# Patient Record
Sex: Female | Born: 2000 | Marital: Single | Smoking: Never smoker
Health system: Southern US, Community
[De-identification: ages and names within clinical notes are randomized; demographics above are authoritative.]

## PROBLEM LIST (undated history)

## (undated) DIAGNOSIS — F429 Obsessive-compulsive disorder, unspecified: Secondary | ICD-10-CM

## (undated) DIAGNOSIS — F329 Major depressive disorder, single episode, unspecified: Secondary | ICD-10-CM

## (undated) DIAGNOSIS — F32A Depression, unspecified: Secondary | ICD-10-CM

## (undated) DIAGNOSIS — F419 Anxiety disorder, unspecified: Secondary | ICD-10-CM

## (undated) HISTORY — DX: Obsessive-compulsive disorder, unspecified: F42.9

## (undated) HISTORY — DX: Depression, unspecified: F32.A

## (undated) HISTORY — DX: Anxiety disorder, unspecified: F41.9

## (undated) HISTORY — PX: WISDOM TOOTH EXTRACTION: SHX21

## (undated) HISTORY — DX: Major depressive disorder, single episode, unspecified: F32.9

## (undated) HISTORY — PX: ADENOIDECTOMY AND MYRINGOTOMY WITH TUBE PLACEMENT: SHX5714

---
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
## 2019-11-17 ENCOUNTER — Other Ambulatory Visit: Payer: Self-pay

## 2019-11-17 ENCOUNTER — Ambulatory Visit: Payer: No Typology Code available for payment source | Admitting: Nurse Practitioner

## 2019-11-17 VITALS — BP 110/70 | HR 89 | Temp 98.0°F | Ht 63.5 in | Wt 157.4 lb

## 2019-11-17 DIAGNOSIS — F330 Major depressive disorder, recurrent, mild: Secondary | ICD-10-CM

## 2019-11-17 DIAGNOSIS — L709 Acne, unspecified: Secondary | ICD-10-CM

## 2019-11-17 DIAGNOSIS — F419 Anxiety disorder, unspecified: Secondary | ICD-10-CM | POA: Diagnosis not present

## 2019-11-17 DIAGNOSIS — R7989 Other specified abnormal findings of blood chemistry: Secondary | ICD-10-CM

## 2019-11-17 DIAGNOSIS — Z0001 Encounter for general adult medical examination with abnormal findings: Secondary | ICD-10-CM

## 2019-11-17 DIAGNOSIS — R251 Tremor, unspecified: Secondary | ICD-10-CM

## 2019-11-17 DIAGNOSIS — Z0000 Encounter for general adult medical examination without abnormal findings: Secondary | ICD-10-CM

## 2019-11-17 LAB — COMPREHENSIVE METABOLIC PANEL
ALT: 11 U/L (ref 0–35)
AST: 14 U/L (ref 0–37)
Albumin: 4.5 g/dL (ref 3.5–5.2)
Alkaline Phosphatase: 52 U/L (ref 47–119)
BUN: 15 mg/dL (ref 6–23)
CO2: 26 mEq/L (ref 19–32)
Calcium: 9.1 mg/dL (ref 8.4–10.5)
Chloride: 106 mEq/L (ref 96–112)
Creatinine, Ser: 0.7 mg/dL (ref 0.40–1.20)
GFR: 107.4 mL/min (ref >60.00)
Glucose, Bld: 87 mg/dL (ref 70–99)
Potassium: 4 mEq/L (ref 3.5–5.1)
Sodium: 139 mEq/L (ref 135–145)
Total Bilirubin: 0.5 mg/dL (ref 0.2–1.2)
Total Protein: 6.7 g/dL (ref 6.0–8.3)

## 2019-11-17 LAB — CBC
HCT: 38.5 % (ref 36.0–49.0)
Hemoglobin: 12.8 g/dL (ref 12.0–16.0)
MCHC: 33.3 g/dL (ref 31.0–37.0)
MCV: 90.6 fl (ref 78.0–98.0)
Platelets: 324 10*3/uL (ref 150.0–575.0)
RBC: 4.25 Mil/uL (ref 3.80–5.70)
RDW: 12.4 % (ref 11.4–15.5)
WBC: 6.4 10*3/uL (ref 4.5–13.5)

## 2019-11-17 LAB — LIPID PANEL
Cholesterol: 192 mg/dL (ref 0–200)
HDL: 45.9 mg/dL (ref >39.00)
LDL Cholesterol: 129 mg/dL — ABNORMAL HIGH (ref 0–99)
NonHDL: 146.33
Total CHOL/HDL Ratio: 4
Triglycerides: 89 mg/dL (ref 0.0–149.0)
VLDL: 17.8 mg/dL (ref 0.0–40.0)

## 2019-11-17 LAB — TSH: TSH: 0.62 u[IU]/mL (ref 0.40–5.00)

## 2019-11-17 LAB — VITAMIN D 25 HYDROXY (VIT D DEFICIENCY, FRACTURES): VITD: 9.57 ng/mL — ABNORMAL LOW (ref 30.00–100.00)

## 2019-11-17 LAB — HEMOGLOBIN A1C: Hgb A1c MFr Bld: 4.9 % (ref 4.6–6.5)

## 2019-11-17 MED ORDER — CHOLECALCIFEROL 1.25 MG (50000 UT) PO TABS: ORAL_TABLET | ORAL | 0 refills | Status: DC

## 2019-11-17 NOTE — Patient Instructions (Addendum)
It was nice to meet you today.  Request for follow-up to dermatology for your acne as you requested.  Please go to the lab today for routine preventative healthcare labs.  Follow-up for routine eye exam annually, dental exam annually, and continue with the behavioral medicine as you are doing.  Reviewed healthy lifestyle with healthy diet, regular exercise.  Please contact us for any questions or concerns and otherwise we can see you back in 1 year.  Preventive Care 15-64 Years Old, Female Preventive care refers to lifestyle choices and visits with your health care provider that can promote health and wellness. At this stage in your life, you may start seeing a primary care physician instead of a pediatrician. Your health care is now your responsibility. Preventive care for young adults includes:  A yearly physical exam. This is also called an annual wellness visit.  Regular dental and eye exams.  Immunizations.  Screening for certain conditions.  Healthy lifestyle choices, such as diet and exercise. What can I expect for my preventive care visit? Physical exam Your health care provider may check:  Height and weight. These may be used to calculate body mass index (BMI), which is a measurement that tells if you are at a healthy weight.  Heart rate and blood pressure.  Body temperature. Counseling Your health care provider may ask you questions about:  Past medical problems and family medical history.  Alcohol, tobacco, and drug use.  Home and relationship well-being.  Access to firearms.  Emotional well-being.  Diet, exercise, and sleep habits.  Sexual activity and sexual health.  Method of birth control.  Menstrual cycle.  Pregnancy history. What immunizations do I need?  Influenza (flu) vaccine  This is recommended every year. Tetanus, diphtheria, and pertussis (Tdap) vaccine  You may need a Td booster every 10 years. Varicella (chickenpox) vaccine  You  may need this vaccine if you have not already been vaccinated. Human papillomavirus (HPV) vaccine  If recommended by your health care provider, you may need three doses over 6 months. Measles, mumps, and rubella (MMR) vaccine  You may need at least one dose of MMR. You may also need a second dose. Meningococcal conjugate (MenACWY) vaccine  One dose is recommended if you are 6-54 years old and a Market researcher living in a residence hall, or if you have one of several medical conditions. You may also need additional booster doses. Pneumococcal conjugate (PCV13) vaccine  You may need this if you have certain conditions and were not previously vaccinated. Pneumococcal polysaccharide (PPSV23) vaccine  You may need one or two doses if you smoke cigarettes or if you have certain conditions. Hepatitis A vaccine  You may need this if you have certain conditions or if you travel or work in places where you may be exposed to hepatitis A. Hepatitis B vaccine  You may need this if you have certain conditions or if you travel or work in places where you may be exposed to hepatitis B. Haemophilus influenzae type b (Hib) vaccine  You may need this if you have certain risk factors. You may receive vaccines as individual doses or as more than one vaccine together in one shot (combination vaccines). Talk with your health care provider about the risks and benefits of combination vaccines. What tests do I need? Blood tests  Lipid and cholesterol levels. These may be checked every 5 years starting at age 34.  Hepatitis C test.  Hepatitis B test. Screening  Pelvic exam and  Pap test. This may be done every 3 years starting at age 45.  Sexually transmitted disease (STD) testing, if you are at risk.  BRCA-related cancer screening. This may be done if you have a family history of breast, ovarian, tubal, or peritoneal cancers. Other tests  Tuberculosis skin test.  Vision and hearing  tests.  Skin exam.  Breast exam. Follow these instructions at home: Eating and drinking   Eat a diet that includes fresh fruits and vegetables, whole grains, lean protein, and low-fat dairy products.  Drink enough fluid to keep your urine pale yellow.  Do not drink alcohol if: ? Your health care provider tells you not to drink. ? You are pregnant, may be pregnant, or are planning to become pregnant. ? You are under the legal drinking age. In the [place] the legal drinking age is 67.  If you drink alcohol: ? Limit how much you have to 0-1 drink a day. ? Be aware of how much alcohol is in your drink. In the [place] one drink equals one 12 oz bottle of beer (355 mL), one 5 oz glass of wine (148 mL), or one 1 oz glass of hard liquor (44 mL). Lifestyle  Take daily care of your teeth and gums.  Stay active. Exercise at least 30 minutes 5 or more days of the week.  Do not use any products that contain nicotine or tobacco, such as cigarettes, e-cigarettes, and chewing tobacco. If you need help quitting, ask your health care provider.  Do not use drugs.  If you are sexually active, practice safe sex. Use a condom or other form of birth control (contraception) in order to prevent pregnancy and STIs (sexually transmitted infections). If you plan to become pregnant, see your health care provider for a pre-conception visit.  Find healthy ways to cope with stress, such as: ? Meditation, yoga, or listening to music. ? Journaling. ? Talking to a trusted person. ? Spending time with friends and family. Safety  Always wear your seat belt while driving or riding in a vehicle.  Do not drive if you have been drinking alcohol. Do not ride with someone who has been drinking.  Do not drive when you are tired or distracted. Do not text while driving.  Wear a helmet and other protective equipment during sports activities.  If you have firearms in your house, make sure you follow all gun safety  procedures.  Seek help if you have been bullied, physically abused, or sexually abused.  Use the Internet responsibly to avoid dangers such as online bullying and online sex predators. What's next?  Go to your health care provider once a year for a well check visit.  Ask your health care provider how often you should have your eyes and teeth checked.  Stay up to date on all vaccines. This information is not intended to replace advice given to you by your health care provider. Make sure you discuss any questions you have with your health care provider. Document Revised: 07/14/2018 Document Reviewed: 07/14/2018 Elsevier Patient Education  2020 [place].  Acne  Acne is a skin problem that causes pimples and other skin changes. The skin has many tiny openings called pores. Each pore contains an oil gland. Oil glands make an oily substance that is called sebum. Acne occurs when the pores in the skin get blocked. The pores may become infected with bacteria, or they may become red, sore, and swollen. Acne is a common skin problem, especially for teenagers.  It often occurs on the face, neck, chest, upper arms, and back. Acne usually goes away over time. What are the causes? Acne is caused when oil glands get blocked with sebum, dead skin cells, and dirt. The bacteria that are normally found in the oil glands then multiply and cause inflammation. Acne is commonly triggered by changes in your hormones. These hormonal changes can cause the oil glands to get bigger and to make more sebum. Factors that can make acne worse include:  Hormone changes during: ? Adolescence. ? Women's menstrual cycles. ? Pregnancy.  Oil-based cosmetics and hair products.  Stress.  Hormone problems that are caused by certain diseases.  Certain medicines.  Pressure from headbands, backpacks, or shoulder pads.  Exposure to certain oils and chemicals.  Eating a diet high in carbohydrates that quickly turn to  sugar. These include dairy products, desserts, and chocolates. What increases the risk? This condition is more likely to develop in:  Teenagers.  People who have a family history of acne. What are the signs or symptoms? Symptoms include:  Small, red bumps (pimples or papules).  Whiteheads.  Blackheads.  Small, pus-filled pimples (pustules).  Big, red pimples or pustules that feel tender. More severe acne can cause:  An abscess. This is an infected area that contains a collection of pus.  Cysts. These are hard, painful, fluid-filled sacs.  Scars. These can happen after large pimples heal. How is this diagnosed? This condition is diagnosed with a medical history and physical exam. Blood tests may also be done. How is this treated? Treatment for this condition can vary depending on the severity of your acne. Treatment may include:  Creams and lotions that prevent oil glands from clogging.  Creams and lotions that treat or prevent infections and inflammation.  Antibiotic medicines that are applied to the skin or taken as a pill.  Pills that decrease sebum production.  Birth control pills.  Light or laser treatments.  Injections of medicine into the affected areas.  Chemicals that cause peeling of the skin.  Surgery. Your health care provider will also recommend the best way to take care of your skin. Good skin care is the most important part of treatment. Follow these instructions at home: Skin care Take care of your skin as told by your health care provider. You may be told to do these things:  Wash your skin gently at least two times each day, as well as: ? After you exercise. ? Before you go to bed.  Use mild soap.  Apply a water-based skin moisturizer after you wash your skin.  Use a sunscreen or sunblock with SPF 30 or greater. This is especially important if you are using acne medicines.  Choose cosmetics that will not block your oil glands (are  noncomedogenic). Medicines  Take over-the-counter and prescription medicines only as told by your health care provider.  If you were prescribed an antibiotic medicine, apply it or take it as told by your health care provider. Do not stop using the antibiotic even if your condition improves. General instructions  Keep your hair clean and off your face. If you have oily hair, shampoo your hair regularly or daily.  Avoid wearing tight headbands or hats.  Avoid picking or squeezing your pimples. That can make your acne worse and cause scarring.  Shave gently and only when necessary.  Keep a food journal to figure out if any foods are linked to your acne. Avoid dairy products, desserts, and chocolates.  Take  steps to manage and reduce stress.  Keep all follow-up visits as told by your health care provider. This is important. Contact a health care provider if:  Your acne is not better after eight weeks.  Your acne gets worse.  You have a large area of skin that is red or tender.  You think that you are having side effects from any acne medicine. Summary  Acne is a skin problem that causes pimples and other skin changes. Acne is a common skin problem, especially for teenagers. Acne usually goes away over time.  Acne is commonly triggered by changes in your hormones. There are many other causes, such as stress, diet, and certain medicines.  Follow your health care provider's instructions for how to take care of your skin. Good skin care is the most important part of treatment.  Take over-the-counter and prescription medicines only as told by your health care provider.  Contact your health care provider if you think that you are having side effects from any acne medicine. This information is not intended to replace advice given to you by your health care provider. Make sure you discuss any questions you have with your health care provider. Document Revised: 11/30/2017 Document  Reviewed: 11/30/2017 Elsevier Patient Education  [place].

## 2019-11-17 NOTE — Progress Notes (Signed)
Established Patient Office Visit  Subjective:  Patient ID: Sylvia Meadows, female    DOB: 2000/08/29  Age: 19 y.o. MRN: [id number]  CC:  Chief Complaint  Patient presents with  . Annual Exam    HPI Sylvia Meadows is a 19 yo here  to establish care with new provider and is fasting  for her preventative care visit and annual labs.  She reports 3 concerns today.  1. Bilateral hands with fine tremors - for many years- and may be a little worse. She does not have problems with her thyroid, blood sugar, palpitations, dizziness lightheadedness or weakness.  She does not know exactly when her hands started to shake, but notes it is all of the time and not sure if it is related to chronic anxiety.  She does not take any prescription or over-the-counter medications.  Mylo  reports her mental health therapist requested that she get physical exam.   2. MDD/ Chronic anxiety/OCD/ADHD- she is having a flare of anxiety. Patient is reports she has been seeing a therapist since she was age 63 to 19 years old.  In addition, she  most recently saw Dr. Einar Grad  in psychiatry in 2018.   She used to take medication and most recently she came off her Zoloft last year.  She tried Celexa and Ritalin in 2018.  She says none of the medication helps her anxiety. She has seen 2  psychiatrists and does not want to see another one now.  She does not want to start on another medication at this time.  She reports she is safe at home. She denies any suicidal or homicidal thoughts.  Her PHQ-9 today was 17 and GAD-7 score of 20. She says her mental health is up and down and has been for years. She feels like she is doing better than in the past. She sees her therapist, Dr. Sondra Come once a week and she believes she is doing better despite the high screening scores today.   She is single and lives at home with her mother and sister.  She reports a good relationship with her mother.  She works in Psychologist, educational in Scientist, research (life sciences).  She currently does not have a boyfriend.   2. Acne on her face and shoulders and is requesting a dermatology referral.  She reports she has tried over-the-counter medication without improvement.  Health maintenance:  Immunizations:Covid 11/04/2019, remaining UTD- see list  Diet: regular Exercise: active - no formal  Pap Smear: to begin age 72 Menses: LMP end of last month, regular and having no problems. EYE: 2 mos ago routine Dentist: UTD  Past Medical History:  Diagnosis Date  . Anxiety   . Depression   . Obsessive-compulsive disorder     Past Surgical History:  Procedure Laterality Date  . ADENOIDECTOMY AND MYRINGOTOMY WITH TUBE PLACEMENT    . WISDOM TOOTH EXTRACTION      Family History  Problem Relation Age of Onset  . Hyperlipidemia Mother   . Diabetes Mother   . Mental illness Mother   . Depression Mother   . Alcohol abuse Father   . Asthma Sister   . Depression Maternal Aunt   . Anxiety disorder Maternal Aunt   . Anxiety disorder Maternal Grandfather   . Depression Maternal Grandfather   . Hyperlipidemia Maternal Grandfather   . Hypertension Maternal Grandfather   . Depression Paternal Grandmother   . Anxiety disorder Paternal Grandmother   . Arthritis Maternal Grandmother   .  Mental illness Maternal Grandmother     Social History   Socioeconomic History  . Marital status: Single    Spouse name: Not on file  . Number of children: Not on file  . Years of education: Not on file  . Highest education level: Not on file  Occupational History  . Occupation: Pensions consultant in [employer]  Tobacco Use  . Smoking status: Never Smoker  . Smokeless tobacco: Never Used  Substance and Sexual Activity  . Alcohol use: No  . Drug use: No  . Sexual activity: Never    Birth control/protection: None  Other Topics Concern  . Not on file  Social History Narrative    She is single and lives at home with her mother and sister.  She works in  Psychologist, educational in Oncologist.  She currently does not have a boyfriend.  She reports she is safe at home.   Social Determinants of Health   Financial Resource Strain:   . Difficulty of Paying Living Expenses:   Food Insecurity:   . Worried About Charity fundraiser in the Last Year:   . Arboriculturist in the Last Year:   Transportation Needs:   . Film/video editor (Medical):   Marland Kitchen Lack of Transportation (Non-Medical):   Physical Activity:   . Days of Exercise per Week:   . Minutes of Exercise per Session:   Stress:   . Feeling of Stress :   Social Connections:   . Frequency of Communication with Friends and Family:   . Frequency of Social Gatherings with Friends and Family:   . Attends Religious Services:   . Active Member of Clubs or Organizations:   . Attends Archivist Meetings:   Marland Kitchen Marital Status:   Intimate Partner Violence:   . Fear of Current or Ex-Partner:   . Emotionally Abused:   Marland Kitchen Physically Abused:   . Sexually Abused:     No Known Allergies   Review of Systems  Constitutional: Negative for activity change, chills, fever and unexpected weight change.  HENT: Negative for congestion and sore throat.   Eyes: Negative.   Respiratory: Negative for cough and shortness of breath.   Cardiovascular: Negative for chest pain and palpitations.  Gastrointestinal: Negative.   Endocrine: Negative for cold intolerance and heat intolerance.  Genitourinary: Negative.   Musculoskeletal: Negative.   Skin:       Acne on face and shoulders.   Neurological: Positive for tremors. Negative for dizziness and light-headedness.  Hematological: Negative for adenopathy. Does not bruise/bleed easily.  Psychiatric/Behavioral:       Per HPI      Objective:    Physical Exam  Constitutional: She is oriented to person, place, and time. She appears well-developed and well-nourished.  HENT:  Head: Normocephalic and atraumatic.  Eyes: Pupils are equal, round,  and reactive to light. Conjunctivae and EOM are normal.  Neck: No thyromegaly present.  Cardiovascular: Normal rate, regular rhythm and normal heart sounds.  Pulmonary/Chest: Effort normal and breath sounds normal.  Abdominal: Soft. There is no abdominal tenderness.  Genitourinary:    Genitourinary Comments: Declines breast exam today.   Musculoskeletal:        General: Normal range of motion.     Cervical back: Normal range of motion and neck supple.  Neurological: She is alert and oriented to person, place, and time. No cranial nerve deficit or sensory deficit.  Reflex Scores:      Patellar reflexes are  2+ on the right side and 2+ on the left side. Fine hand tremors bilat as seen when holding a piece of paper.   Skin: Skin is warm and dry.  Light acne forehead, nose Nose ring.    Psychiatric: Her speech is normal and behavior is normal. Judgment and thought content normal. Her mood appears anxious. Cognition and memory are normal.  Vitals reviewed.   BP 110/70   Pulse 89   Temp 98 F (36.7 C) (Skin)   Ht 5' 3.5" (1.613 m)   Wt 157 lb 6.4 oz (71.4 kg)   SpO2 97%   BMI 27.44 kg/m  Wt Readings from Last 3 Encounters:  11/17/19 157 lb 6.4 oz (71.4 kg) (86 %, Z= 1.10)*  09/22/18 149 lb 6.4 oz (67.8 kg) (83 %, Z= 0.97)*  08/25/18 144 lb 9.6 oz (65.6 kg) (80 %, Z= 0.83)*   * Growth percentiles are based on CDC (Girls, 2-20 Years) data.     Health Maintenance Due  Topic Date Due  . HIV Screening  Never done  . COVID-19 Vaccine (1) Never done    There are no preventive care reminders to display for this patient.  Lab Results  Component Value Date   TSH 0.62 11/17/2019   Lab Results  Component Value Date   WBC 6.4 11/17/2019   HGB 12.8 11/17/2019   HCT 38.5 11/17/2019   MCV 90.6 11/17/2019   PLT 324.0 11/17/2019   Lab Results  Component Value Date   NA 139 11/17/2019   K 4.0 11/17/2019   CO2 26 11/17/2019   GLUCOSE 87 11/17/2019   BUN 15 11/17/2019    CREATININE 0.70 11/17/2019   BILITOT 0.5 11/17/2019   ALKPHOS 52 11/17/2019   AST 14 11/17/2019   ALT 11 11/17/2019   PROT 6.7 11/17/2019   ALBUMIN 4.5 11/17/2019   CALCIUM 9.1 11/17/2019   GFR 107.40 11/17/2019   Lab Results  Component Value Date   CHOL 192 11/17/2019   Lab Results  Component Value Date   HDL 45.90 11/17/2019   Lab Results  Component Value Date   LDLCALC 129 (H) 11/17/2019   Lab Results  Component Value Date   TRIG 89.0 11/17/2019   Lab Results  Component Value Date   CHOLHDL 4 11/17/2019   Lab Results  Component Value Date   HGBA1C 4.9 11/17/2019      Assessment & Plan:   Problem List Items Addressed This Visit      Other   MDD (major depressive disorder), recurrent episode, mild ([place])    Other Visit Diagnoses    Encounter for preventative adult health care examination    -  Primary   Relevant Orders   Comp Met (CMET) (Completed)   TSH (Completed)   HgB A1c (Completed)   Vitamin D (25 hydroxy) (Completed)   Lipid Profile (Completed)   CBC (Completed)   Acne, unspecified acne type       Relevant Orders   Ambulatory referral to Dermatology   Anxiety         1. Request for follow-up to dermatology for your acne as you requested.  2. She declined a Psychiatric referral. She reports she is seeing Dr. Sondra Come and advised continue with her behavioral therapist who may also have a psychiatrist recommendation.   3. For hand fine tremors: Check TSH and treat anxiety. She declines any medication today.   4. Please go to the lab today for routine preventative healthcare labs- she  presented fasting.  Follow-up for routine eye exam annually, dental exam annually.Screening: ASCCP guidelines were reviewed.  Pap smears are recommended beginning at age 52. Reviewed healthy lifestyle with healthy diet, regular exercise, seatbelt use, avoid texting and driving, smoking, and underage alcohol use.    Please contact us for any questions or  concerns and otherwise we can see you back in 1 year.  Meds ordered this encounter  Medications  . Cholecalciferol 1.25 MG (50000 UT) TABS    Sig: 50,000 units PO qwk for 12 weeks.    Dispense:  12 tablet    Refill:  0    Order Specific Question:   Supervising Provider    Answer:   Einar Pheasant [[id number]]   This visit occurred during the SARS-CoV-2 public health emergency.  Safety protocols were in place, including screening questions prior to the visit, additional usage of staff PPE, and extensive cleaning of exam room while observing appropriate contact time as indicated for disinfecting solutions.     Follow-up: No follow-ups on file.    Denice Paradise, NP

## 2019-11-18 MED FILL — VIT D3-50 50,000 UNITS CAPS: 1.25 MG | 84 days supply | Qty: 12 | Fill #0

## 2019-11-19 ENCOUNTER — Telehealth: Payer: Self-pay | Admitting: Nurse Practitioner

## 2019-11-19 ENCOUNTER — Encounter: Payer: Self-pay | Admitting: Nurse Practitioner

## 2019-11-19 DIAGNOSIS — L709 Acne, unspecified: Secondary | ICD-10-CM

## 2019-11-19 DIAGNOSIS — F330 Major depressive disorder, recurrent, mild: Secondary | ICD-10-CM | POA: Insufficient documentation

## 2019-11-19 DIAGNOSIS — R251 Tremor, unspecified: Secondary | ICD-10-CM

## 2019-11-19 DIAGNOSIS — R7989 Other specified abnormal findings of blood chemistry: Secondary | ICD-10-CM

## 2019-11-19 HISTORY — DX: Tremor, unspecified: R25.1

## 2019-11-19 HISTORY — DX: Other specified abnormal findings of blood chemistry: R79.89

## 2019-11-19 HISTORY — DX: Acne, unspecified: L70.9

## 2019-11-19 NOTE — Assessment & Plan Note (Signed)
  Last vitamin D Lab Results  Component Value Date   VD25OH 9.57 (L) 11/17/2019  Labs returned with very low Vit D level <12 and according to. UTD at risk for developing osteomalacia.  We have already checked the routine labs and they are all normal : Ca, renal, electrolytes, liver including a normal ALKP, but  they recommend adding: phosphorus, PTH, and TTG antibodies to assess for celiac disease. Labs ordered for 11/24/19. Will begin Vit D supplement.

## 2019-11-19 NOTE — Assessment & Plan Note (Signed)
4. She wants a different DERM referral- [facility] and her mother wants her to go to [facility]- no specified provider.

## 2019-11-19 NOTE — Assessment & Plan Note (Addendum)
1. Labs return with TSH low normal 0.62 Will check thyroid panel 11/24/19.   2. Because of fine motor tremors in her hands- for years  plus depression,  I would add beginning work up for [employer] with serum ceruloplasmin, a good slit lamp evaluation by her established optometrist, Dr. Clydene Pugh. And I will consider need for a 24 hour urine  for copper based on the above results.   3. Consider neurology consult if all negative.

## 2019-11-19 NOTE — Telephone Encounter (Signed)
I called her today with results of her labs vit D 9.7, TSH low normal 0.62  If very low Vit D level <12 and according to. UTD at risk for developing osteomalacia.  We have already checked the routine labs and they are all normal : Ca, renal, electrolytes, liver including a normal ALKP, but  they recommend adding: phosphorus, PTH, and TTG antibodies to assess for celiac disease.   PLAN: 1. Please call her [phone] with a lab appt before 1 pm next [facility] 11/24/19.   2. Because of fine motor tremors plus depression,  I would add beginning work up for [employer] with serum ceruloplasmin, a good slit lamp evaluation by her established optometrist, Dr. Clydene Pugh. And I will consider need for a 24 hour urine  for copper based on the above results.    Her liver panel is normal, but neurological Sx (fine tremors- I've had for a long time) plus  depression need Wilson's ruled out.   3. Consider neurology consult if all negative.   4. She wants a different DERM referral- [facility] and her mother wants her to go to [facility]- no specified provider.

## 2019-11-20 NOTE — Telephone Encounter (Signed)
Mychart has been sent to inform patient. 

## 2019-11-22 DIAGNOSIS — R251 Tremor, unspecified: Secondary | ICD-10-CM

## 2019-11-24 ENCOUNTER — Other Ambulatory Visit: Payer: Self-pay

## 2019-11-24 ENCOUNTER — Other Ambulatory Visit: Payer: No Typology Code available for payment source

## 2019-11-24 DIAGNOSIS — R251 Tremor, unspecified: Secondary | ICD-10-CM

## 2019-11-24 DIAGNOSIS — R7989 Other specified abnormal findings of blood chemistry: Secondary | ICD-10-CM

## 2019-11-24 DIAGNOSIS — F330 Major depressive disorder, recurrent, mild: Secondary | ICD-10-CM

## 2019-11-24 NOTE — Addendum Note (Signed)
Addended by: Warden Fillers on: 11/24/2019 10:39 AM   Modules accepted: Orders

## 2019-11-28 LAB — THYROID PANEL WITH TSH
Free Thyroxine Index: 2.2 (ref 1.4–3.8)
T3 Uptake: 31 % (ref 22–35)
T4, Total: 7 ug/dL (ref 5.3–11.7)
TSH: 0.72 mIU/L

## 2019-11-28 LAB — CERULOPLASMIN: Ceruloplasmin: 23 mg/dL (ref 18–53)

## 2019-11-28 LAB — CELIAC DISEASE PANEL
(tTG) Ab, IgA: 1 U/mL
(tTG) Ab, IgG: 2 U/mL
Gliadin IgA: 18 Units
Gliadin IgG: 3 Units
Immunoglobulin A: 134 mg/dL (ref 47–310)

## 2019-11-28 LAB — PHOSPHORUS: Phosphorus: 3.2 mg/dL (ref 2.5–4.5)

## 2019-11-28 LAB — PARATHYROID HORMONE, INTACT (NO CA): PTH: 33 pg/mL (ref 14–64)

## 2019-11-29 NOTE — Telephone Encounter (Signed)
LMTCB

## 2019-11-29 NOTE — Telephone Encounter (Signed)
Please call her: Special labs looking at reasons why your vitamin D is so low are all normal. I cannot explain your tremors. Would you like a referral to NEUROLOGY? Did you get a DERMATOLOGY appt?

## 2019-12-07 NOTE — Telephone Encounter (Signed)
I have placed a neurology referral for chronic and significant hand tremors. Normal thyroid panel. Positive anxiety.

## 2019-12-07 NOTE — Telephone Encounter (Signed)
  I have placed a neurology referral for chronic and significant hand tremors. Normal thyroid panel. Positive anxiety.

## 2020-01-19 ENCOUNTER — Other Ambulatory Visit: Payer: Self-pay

## 2020-01-19 ENCOUNTER — Ambulatory Visit (INDEPENDENT_AMBULATORY_CARE_PROVIDER_SITE_OTHER): Payer: No Typology Code available for payment source | Admitting: Nurse Practitioner

## 2020-01-19 VITALS — BP 102/70 | HR 89 | Temp 97.1°F | Resp 16 | Wt 152.6 lb

## 2020-01-19 DIAGNOSIS — F329 Major depressive disorder, single episode, unspecified: Secondary | ICD-10-CM

## 2020-01-19 DIAGNOSIS — M25561 Pain in right knee: Secondary | ICD-10-CM | POA: Diagnosis not present

## 2020-01-19 DIAGNOSIS — F419 Anxiety disorder, unspecified: Secondary | ICD-10-CM | POA: Diagnosis not present

## 2020-01-19 DIAGNOSIS — F32A Depression, unspecified: Secondary | ICD-10-CM

## 2020-01-19 NOTE — Patient Instructions (Addendum)
Knee sleeve for support  Try taking Aleve 1 -2 per day. Tylenol as directed Rest, ice and elevate when home.   If no improvement in 2 please return for office visit.    I placed a Psychiatry consult. You will be called with an appointment.  Let me know if you have not heard from anyone 2 weeks.   Acute Knee Pain, Adult Acute knee pain is sudden and may be caused by damage, swelling, or irritation of the muscles and tissues that support your knee. The injury may result from:  A fall.  An injury to your knee from twisting motions.  A hit to the knee.  Infection. Acute knee pain may go away on its own with time and rest. If it does not, your health care provider may order tests to find the cause of the pain. These may include:  Imaging tests, such as an X-ray, MRI, or ultrasound.  Joint aspiration. In this test, fluid is removed from the knee.  Arthroscopy. In this test, a lighted tube is inserted into the knee and an image is projected onto a TV screen.  Biopsy. In this test, a sample of tissue is removed from the body and studied under a microscope. Follow these instructions at home: Pay attention to any changes in your symptoms. Take these actions to relieve your pain. If you have a knee sleeve or brace:   Wear the sleeve or brace as told by your health care provider. Remove it only as told by your health care provider.  Loosen the sleeve or brace if your toes tingle, become numb, or turn cold and blue.  Keep the sleeve or brace clean.  If the sleeve or brace is not waterproof: ? Do not let it get wet. ? Cover it with a watertight covering when you take a bath or shower. Activity  Rest your knee.  Do not do things that cause pain or make pain worse.  Avoid high-impact activities or exercises, such as running, jumping rope, or doing jumping jacks.  Work with a physical therapist to make a safe exercise program, as recommended by your health care provider. Do  exercises as told by your physical therapist. Managing pain, stiffness, and swelling   If directed, put ice on the knee: ? Put ice in a plastic bag. ? Place a towel between your skin and the bag. ? Leave the ice on for 20 minutes, 2-3 times a day.  If directed, use an elastic bandage to put pressure (compression) on your injured knee. This may control swelling, give support, and help with discomfort. General instructions  Take over-the-counter and prescription medicines only as told by your health care provider.  Raise (elevate) your knee above the level of your heart when you are sitting or lying down.  Sleep with a pillow under your knee.  Do not use any products that contain nicotine or tobacco, such as cigarettes, e-cigarettes, and chewing tobacco. These can delay healing. If you need help quitting, ask your health care provider.  If you are overweight, work with your health care provider and a dietitian to set a weight-loss goal that is healthy and reasonable for you. Extra weight can put pressure on your knee.  Keep all follow-up visits as told by your health care provider. This is important. Contact a health care provider if:  Your knee pain continues, changes, or gets worse.  You have a fever along with knee pain.  Your knee feels warm to the  touch.  Your knee buckles or locks up. Get help right away if:  Your knee swells, and the swelling becomes worse.  You cannot move your knee.  You have severe pain in your knee. Summary  Acute knee pain can be caused by a fall, an injury, an infection, or damage, swelling, or irritation of the tissues that support your knee.  Your health care provider may perform tests to find out the cause of the pain.  Pay attention to any changes in your symptoms. Relieve your pain with rest, medicines, light activity, and use of ice.  Get help if your pain continues or becomes worse, your knee swells, or you cannot move your knee. This  information is not intended to replace advice given to you by your health care provider. Make sure you discuss any questions you have with your health care provider. Document Revised: 12/30/2017 Document Reviewed: 12/30/2017 Elsevier Patient Education  [name].

## 2020-01-19 NOTE — Progress Notes (Signed)
Established Patient Office Visit  Subjective:  Patient ID: Sylvia Meadows, female    DOB: [birth year]  Age: 19 y.o. MRN: [id number]  CC:  Chief Complaint  Patient presents with  . Knee Pain    HPI Sylvia Meadows presents for knee pain that started to hurt a few days ago after work. 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 No exercise or sports. No unusual activity.  She has discomfort in the center of her knee. No  injury or trauma. Her mom looked at it and it looked a little swollen. She applied ice. She took Aleve 1-2 tablets. It is starting to feel better today.   Anxiety/OCD: She weaned off her Zoloft 150 mg in 2020. She has a history of anxiety and depression and some OCD. She declines restarting pharmacotherapy.  She has been following with a behavioral therapist instead.  Patient reports she is still in a good place in her life with friends and work. No SI/HI.  She is feeling anxious. We have discussed adding back pharmacotherapy. We have discussed adding psychiatry opinion.  She comes in today reporting that she would be in favor of a psychiatrist referral.    Past Medical History:  Diagnosis Date  . Acne 11/19/2019  . Anxiety   . Depression   . Low vitamin D level 11/19/2019  . Obsessive-compulsive disorder   . Tremors of nervous system 11/19/2019    Past Surgical History:  Procedure Laterality Date  . ADENOIDECTOMY AND MYRINGOTOMY WITH TUBE PLACEMENT    . WISDOM TOOTH EXTRACTION      Family History  Problem Relation Age of Onset  . Hyperlipidemia Mother   . Diabetes Mother   . Mental illness Mother   . Depression Mother   . Alcohol abuse Father   . Asthma Sister   . Depression Maternal Aunt   . Anxiety disorder Maternal Aunt   . Anxiety disorder Maternal Grandfather   . Depression Maternal Grandfather   . Hyperlipidemia Maternal Grandfather   . Hypertension Maternal Grandfather   . Depression Paternal Grandmother   . Anxiety disorder  Paternal Grandmother   . Arthritis Maternal Grandmother   . Mental illness Maternal Grandmother     Social History   Socioeconomic History  . Marital status: Single    Spouse name: Not on file  . Number of children: Not on file  . Years of education: Not on file  . Highest education level: Not on file  Occupational History  . Occupation: Pensions consultant in [employer]  Tobacco Use  . Smoking status: Never Smoker  . Smokeless tobacco: Never Used  Vaping Use  . Vaping Use: Never used  Substance and Sexual Activity  . Alcohol use: No  . Drug use: No  . Sexual activity: Never    Birth control/protection: None  Other Topics Concern  . Not on file  Social History Narrative    She is single and lives at home with her mother and sister.  She works in Psychologist, educational in Oncologist.  She currently does not have a boyfriend.  She reports she is safe at home.   Social Determinants of Health   Financial Resource Strain:   . Difficulty of Paying Living Expenses:   Food Insecurity:   . Worried About Charity fundraiser in the Last Year:   . Arboriculturist in the Last Year:   Transportation Needs:   . Lack of Transportation (  Medical):   Marland Kitchen Lack of Transportation (Non-Medical):   Physical Activity:   . Days of Exercise per Week:   . Minutes of Exercise per Session:   Stress:   . Feeling of Stress :   Social Connections:   . Frequency of Communication with Friends and Family:   . Frequency of Social Gatherings with Friends and Family:   . Attends Religious Services:   . Active Member of Clubs or Organizations:   . Attends Banker Meetings:   Marland Kitchen Marital Status:   Intimate Partner Violence:   . Fear of Current or Ex-Partner:   . Emotionally Abused:   Marland Kitchen Physically Abused:   . Sexually Abused:     Outpatient Medications Prior to Visit  Medication Sig Dispense Refill  . Cholecalciferol 1.25 MG (50000 UT) TABS 50,000 units PO qwk for 12 weeks. 12 tablet  0   No facility-administered medications prior to visit.    No Known Allergies  Review of Systems  Musculoskeletal: Negative for back pain, gait problem and myalgias.  Neurological: Positive for tremors.  Psychiatric/Behavioral: Negative for behavioral problems, confusion, sleep disturbance and suicidal ideas.     Objective:    Physical Exam Musculoskeletal:        General: Normal range of motion.     Right knee: No swelling, deformity, effusion, erythema, ecchymosis or crepitus. Normal range of motion. Tenderness present over the lateral joint line. Normal alignment.    BP 102/70   Pulse 89   Temp (!) 97.1 F (36.2 C)   Resp 16   Wt 152 lb 9.6 oz (69.2 kg)   SpO2 97%   BMI 26.61 kg/m  Wt Readings from Last 3 Encounters:  01/19/20 152 lb 9.6 oz (69.2 kg) (83 %, Z= 0.94)*  11/17/19 157 lb 6.4 oz (71.4 kg) (86 %, Z= 1.10)*  09/22/18 149 lb 6.4 oz (67.8 kg) (83 %, Z= 0.97)*   * Growth percentiles are based on CDC (Girls, 2-20 Years) data.     Health Maintenance Due  Topic Date Due  . Hepatitis C Screening  Never done  . HIV Screening  Never done  . COVID-19 Vaccine (2 - Pfizer 2-dose series) 11/25/2019    There are no preventive care reminders to display for this patient.  Lab Results  Component Value Date   TSH 0.72 11/24/2019   Lab Results  Component Value Date   WBC 6.4 11/17/2019   HGB 12.8 11/17/2019   HCT 38.5 11/17/2019   MCV 90.6 11/17/2019   PLT 324.0 11/17/2019   Lab Results  Component Value Date   NA 139 11/17/2019   K 4.0 11/17/2019   CO2 26 11/17/2019   GLUCOSE 87 11/17/2019   BUN 15 11/17/2019   CREATININE 0.70 11/17/2019   BILITOT 0.5 11/17/2019   ALKPHOS 52 11/17/2019   AST 14 11/17/2019   ALT 11 11/17/2019   PROT 6.7 11/17/2019   ALBUMIN 4.5 11/17/2019   CALCIUM 9.1 11/17/2019   GFR 107.40 11/17/2019   Lab Results  Component Value Date   CHOL 192 11/17/2019   Lab Results  Component Value Date   HDL 45.90 11/17/2019    Lab Results  Component Value Date   LDLCALC 129 (H) 11/17/2019   Lab Results  Component Value Date   TRIG 89.0 11/17/2019   Lab Results  Component Value Date   CHOLHDL 4 11/17/2019   Lab Results  Component Value Date   HGBA1C 4.9 11/17/2019  Assessment & Plan:   Problem List Items Addressed This Visit      Other   Anxiety and depression - Primary   Relevant Orders   Ambulatory referral to Psychiatry   Acute pain of right knee     No orders of the defined types were placed in this encounter.  Knee sleeve for support  Try taking Aleve 1 -2 per day. Tylenol as directed Rest, ice and elevate when home.   If no improvement in 2 please return for office visit.    I placed a Psychiatry consult. You will be called with an appointment.  Let me know if you have not heard from anyone 2 weeks.  Follow-up: Return in about 2 weeks (around 02/02/2020).  This visit occurred during the SARS-CoV-2 public health emergency.  Safety protocols were in place, including screening questions prior to the visit, additional usage of staff PPE, and extensive cleaning of exam room while observing appropriate contact time as indicated for disinfecting solutions.      Amedeo Kinsman, NP

## 2020-01-20 ENCOUNTER — Encounter: Payer: Self-pay | Admitting: Nurse Practitioner

## 2020-01-26 ENCOUNTER — Encounter: Payer: Self-pay | Admitting: Diagnostic Neuroimaging

## 2020-01-26 ENCOUNTER — Other Ambulatory Visit: Payer: Self-pay

## 2020-01-26 ENCOUNTER — Ambulatory Visit: Payer: No Typology Code available for payment source | Admitting: Diagnostic Neuroimaging

## 2020-01-26 VITALS — BP 115/72 | HR 91 | Ht 64.0 in | Wt 156.0 lb

## 2020-01-26 DIAGNOSIS — R251 Tremor, unspecified: Secondary | ICD-10-CM | POA: Diagnosis not present

## 2020-01-26 NOTE — Progress Notes (Signed)
[facility]  PATIENT: Sylvia Meadows DOB: 2001-08-01  REFERRING CLINICIAN: Theadore Nan, NP HISTORY FROM: patient  REASON FOR VISIT: new consult    HISTORICAL  CHIEF COMPLAINT:  Chief Complaint  Patient presents with  . New Patient (Initial Visit)    hand tremors; mostly located in R hand but sometimes both. She notices it happening frequently and it will occur even at rest. Has been going on for a couple of years but has been getting worse over the last 4-6 months.    . Referral    Theadore Nan, NP  . Room 6    here with mother    HISTORY OF PRESENT ILLNESS:   19 year old female here for evaluation of tremors.  Patient has history of anxiety since age [age] years old.  She is also had intermittent tremors in her hands since that time.  Symptoms worse with anxiety or physical activity and exertion.  She drinks 1 soda per day.  She sleeps about 5 hours per night.  Sometimes feels some tremors in her legs.   REVIEW OF SYSTEMS: Full 14 system review of systems performed and negative with exception of: as per HPI.   ALLERGIES: No Known Allergies  HOME MEDICATIONS: Outpatient Medications Prior to Visit  Medication Sig Dispense Refill  . Cholecalciferol 1.25 MG (50000 UT) TABS 50,000 units PO qwk for 12 weeks. 12 tablet 0   No facility-administered medications prior to visit.    PAST MEDICAL HISTORY: Past Medical History:  Diagnosis Date  . Acne 11/19/2019  . Anxiety   . Depression   . Low vitamin D level 11/19/2019  . Obsessive-compulsive disorder   . Tremors of nervous system 11/19/2019    PAST SURGICAL HISTORY: Past Surgical History:  Procedure Laterality Date  . ADENOIDECTOMY AND MYRINGOTOMY WITH TUBE PLACEMENT    . WISDOM TOOTH EXTRACTION      FAMILY HISTORY: Family History  Problem Relation Age of Onset  . Hyperlipidemia Mother   . Diabetes Mother   . Mental illness Mother   . Depression Mother   . Alcohol abuse Father   .  Asthma Sister   . Depression Maternal Aunt   . Anxiety disorder Maternal Aunt   . Anxiety disorder Maternal Grandfather   . Depression Maternal Grandfather   . Hyperlipidemia Maternal Grandfather   . Hypertension Maternal Grandfather   . Depression Paternal Grandmother   . Anxiety disorder Paternal Grandmother   . Arthritis Maternal Grandmother   . Mental illness Maternal Grandmother   . Tremor Neg Hx     SOCIAL HISTORY: Social History   Socioeconomic History  . Marital status: Single    Spouse name: Not on file  . Number of children: Not on file  . Years of education: Not on file  . Highest education level: Not on file  Occupational History  . Occupation: Product manager in Associate Professor  Tobacco Use  . Smoking status: Never Smoker  . Smokeless tobacco: Never Used  Vaping Use  . Vaping Use: Never used  Substance and Sexual Activity  . Alcohol use: No  . Drug use: No  . Sexual activity: Never    Birth control/protection: None  Other Topics Concern  . Not on file  Social History Narrative    She is single and lives at home with her mother and sister.  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  She currently does not have a boyfriend.  She reports she is safe at  home.   Social Determinants of Health   Financial Resource Strain:   . Difficulty of Paying Living Expenses:   Food Insecurity:   . Worried About Charity fundraiser in the Last Year:   . Arboriculturist in the Last Year:   Transportation Needs:   . Film/video editor (Medical):   Marland Kitchen Lack of Transportation (Non-Medical):   Physical Activity:   . Days of Exercise per Week:   . Minutes of Exercise per Session:   Stress:   . Feeling of Stress :   Social Connections:   . Frequency of Communication with Friends and Family:   . Frequency of Social Gatherings with Friends and Family:   . Attends Religious Services:   . Active Member of Clubs or Organizations:   . Attends Archivist  Meetings:   Marland Kitchen Marital Status:   Intimate Partner Violence:   . Fear of Current or Ex-Partner:   . Emotionally Abused:   Marland Kitchen Physically Abused:   . Sexually Abused:      PHYSICAL EXAM  GENERAL EXAM/CONSTITUTIONAL: Vitals:  Vitals:   01/26/20 0957  BP: 115/72  Pulse: 91  Weight: 156 lb (70.8 kg)  Height: 5\' 4"  (1.626 m)     Body mass index is 26.78 kg/m. Wt Readings from Last 3 Encounters:  01/26/20 156 lb (70.8 kg) (85 %, Z= 1.04)*  01/19/20 152 lb 9.6 oz (69.2 kg) (83 %, Z= 0.94)*  11/17/19 157 lb 6.4 oz (71.4 kg) (86 %, Z= 1.10)*   * Growth percentiles are based on CDC (Girls, 2-20 Years) data.     Patient is in no distress; well developed, nourished and groomed; neck is supple  CARDIOVASCULAR:  Examination of carotid arteries is normal; no carotid bruits  Regular rate and rhythm, no murmurs  Examination of peripheral vascular system by observation and palpation is normal  EYES:  Ophthalmoscopic exam of optic discs and posterior segments is normal; no papilledema or hemorrhages  No exam data present  MUSCULOSKELETAL:  Gait, strength, tone, movements noted in Neurologic exam below  NEUROLOGIC: MENTAL STATUS:  No flowsheet data found.  awake, alert, oriented to person, place and time  recent and remote memory intact  normal attention and concentration  language fluent, comprehension intact, naming intact  fund of knowledge appropriate  CRANIAL NERVE:   2nd - no papilledema on fundoscopic exam  2nd, 3rd, 4th, 6th - pupils equal and reactive to light, visual fields full to confrontation, extraocular muscles intact, no nystagmus  5th - facial sensation symmetric  7th - facial strength symmetric  8th - hearing intact  9th - palate elevates symmetrically, uvula midline  11th - shoulder shrug symmetric  12th - tongue protrusion midline  MOTOR:   normal bulk and tone, full strength in the BUE, BLE  SENSORY:   normal and symmetric to  light touch, temperature, vibration  COORDINATION:   finger-nose-finger, fine finger movements normal  REFLEXES:   deep tendon reflexes present and symmetric  GAIT/STATION:   narrow based gait     DIAGNOSTIC DATA (LABS, IMAGING, TESTING) - I reviewed patient records, labs, notes, testing and imaging myself where available.  Lab Results  Component Value Date   WBC 6.4 11/17/2019   HGB 12.8 11/17/2019   HCT 38.5 11/17/2019   MCV 90.6 11/17/2019   PLT 324.0 11/17/2019      Component Value Date/Time   NA 139 11/17/2019 1044   K 4.0 11/17/2019 1044   CL  106 11/17/2019 1044   CO2 26 11/17/2019 1044   GLUCOSE 87 11/17/2019 1044   BUN 15 11/17/2019 1044   CREATININE 0.70 11/17/2019 1044   CALCIUM 9.1 11/17/2019 1044   PROT 6.7 11/17/2019 1044   ALBUMIN 4.5 11/17/2019 1044   AST 14 11/17/2019 1044   ALT 11 11/17/2019 1044   ALKPHOS 52 11/17/2019 1044   BILITOT 0.5 11/17/2019 1044   Lab Results  Component Value Date   CHOL 192 11/17/2019   HDL 45.90 11/17/2019   LDLCALC 129 (H) 11/17/2019   TRIG 89.0 11/17/2019   CHOLHDL 4 11/17/2019   Lab Results  Component Value Date   HGBA1C 4.9 11/17/2019   No results found for: [id number] Lab Results  Component Value Date   TSH 0.72 11/24/2019       ASSESSMENT AND PLAN  19 y.o. year old female here with intermittent postural tremor in hands and legs since age 13 to 19 years old, sometimes associated with anxiety.  We will proceed with further work-up.  Dx:  1. 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 MR BRAIN W WO CONTRAST   Return for pending if symptoms worsen or fail to improve.    Suanne Marker, MD 01/26/2020, 10:22 AM Certified in Neurology, Neurophysiology and Neuroimaging  [facility] [address] [place], [place] [address] [phone]

## 2020-02-01 ENCOUNTER — Telehealth: Payer: Self-pay | Admitting: Diagnostic Neuroimaging

## 2020-02-01 NOTE — Telephone Encounter (Signed)
no covid questions MR Brain w/wo contrast Dr. Verlene Mayer [facility] Berkley Harvey: [phone] (exp. 02/06/20 to 03/07/20). Patient is scheduled at [facility] for 02/07/20.

## 2020-02-02 ENCOUNTER — Telehealth: Payer: Self-pay | Admitting: Nurse Practitioner

## 2020-02-02 NOTE — Telephone Encounter (Signed)
Rejection Reason - Patient Declined - Patient cancelled appointment and did not reschedule.Marland Kitchenmb" [facility] said about 4 hours ago

## 2020-02-07 ENCOUNTER — Other Ambulatory Visit: Payer: Self-pay

## 2020-02-07 ENCOUNTER — Ambulatory Visit: Payer: No Typology Code available for payment source

## 2020-02-07 DIAGNOSIS — R251 Tremor, unspecified: Secondary | ICD-10-CM | POA: Diagnosis not present

## 2020-02-07 MED ORDER — GADOBENATE DIMEGLUMINE 529 MG/ML IV SOLN
15.0000 mL | Freq: Once | INTRAVENOUS | Status: AC | PRN
Start: 2020-02-07 — End: 2020-02-07
  Administered 2020-02-07: 15 mL via INTRAVENOUS

## 2020-02-12 ENCOUNTER — Encounter: Payer: Self-pay | Admitting: *Deleted

## 2020-04-01 ENCOUNTER — Other Ambulatory Visit: Payer: Self-pay

## 2020-04-01 ENCOUNTER — Telehealth (INDEPENDENT_AMBULATORY_CARE_PROVIDER_SITE_OTHER): Payer: 59 | Admitting: Psychiatry

## 2020-04-01 DIAGNOSIS — F330 Major depressive disorder, recurrent, mild: Secondary | ICD-10-CM

## 2020-04-04 ENCOUNTER — Telehealth: Payer: Self-pay | Admitting: Nurse Practitioner

## 2020-04-04 NOTE — Telephone Encounter (Signed)
Rejection Reason - Patient was No Show" [facility] said about 17 hours ago

## 2020-04-04 NOTE — Telephone Encounter (Signed)
Please call her and set up psych appt.

## 2020-04-10 NOTE — Telephone Encounter (Signed)
Spoke with patient and she states she will call them to reschedule her appt. I asked if she needed the number; patient declined and said she has it.

## 2020-08-14 ENCOUNTER — Telehealth: Payer: Self-pay

## 2020-08-14 NOTE — Telephone Encounter (Signed)
Pt's mother Rosey Bath called and asked if her daughter could be seen for knee injury. I let her know that I could search for an appt with one of our providers for an acute issue. I then let her know that none of our providers were taking on new patients or Kim's pts. I let her know about establishing care with [facility]. She states that she WILL NOT be going anywhere else and that her daughter WILL be seen by someone in this practice. I told her again that none of our providers were taking on new patients and she asked to speak to a Production designer, theatre/television/film. Pt's mother was very rude.  I got her call back number so I could ask Pete Pelt to give her a call. [phone]

## 2020-08-14 NOTE — Telephone Encounter (Signed)
Upon further investigation,  Pt's mother is not on the [facility] for [facility]. I reached out to the pt to let her know the number of [facility] to establish care. She asked how she could get her mother on her DPR and I let her know she would need to visit a [facility] and fill one out. She said thank you and the call ended.

## 2020-09-13 ENCOUNTER — Encounter: Payer: Self-pay | Admitting: Internal Medicine

## 2020-09-13 ENCOUNTER — Ambulatory Visit (INDEPENDENT_AMBULATORY_CARE_PROVIDER_SITE_OTHER): Payer: No Typology Code available for payment source

## 2020-09-13 ENCOUNTER — Other Ambulatory Visit: Payer: Self-pay

## 2020-09-13 ENCOUNTER — Ambulatory Visit (INDEPENDENT_AMBULATORY_CARE_PROVIDER_SITE_OTHER): Payer: No Typology Code available for payment source | Admitting: Internal Medicine

## 2020-09-13 VITALS — BP 120/70 | HR 81 | Temp 98.1°F | Ht 64.02 in | Wt 146.2 lb

## 2020-09-13 DIAGNOSIS — M25561 Pain in right knee: Secondary | ICD-10-CM

## 2020-09-13 DIAGNOSIS — M7051 Other bursitis of knee, right knee: Secondary | ICD-10-CM

## 2020-09-13 NOTE — Patient Instructions (Signed)
Your episodes of knee pain are due to bursitis (fluid collection due to inflammation of the joint).  This may be caused by an unstable patella.   For your next episode of pain   Aleve 1 tablet every 12 hours when needed for knee pain/swelling  You can add up to 2000 mg of acetominophen (tylenol) every day safely  In divided doses (500 mg every 6 hours  Or 1000 mg every 12 hours.)    Ice for 15-20 minutes every 4 hours.   TO STRENGTHEN YOUR PATELLA   Strengthen your quads,  Which support proper alignment of your knee cap wit 3 sets of 10 full knee extensions daily  When it gets easy,  Add an ankle weight (2 lbs)

## 2020-09-13 NOTE — Progress Notes (Signed)
Subjective:  Patient ID: Sylvia Meadows, female    DOB: 02-12-2001  Age: 20 y.o. MRN: [id number]  CC: The primary encounter diagnosis was Right anterior knee pain. A diagnosis of Bursitis of right knee, unspecified bursa was also pertinent to this visit.  HPI Sylvia Meadows presents for transfer of care from [facility] [facility].  Referred by her mother  This visit occurred during the SARS-CoV-2 public health emergency.  Safety protocols were in place, including screening questions prior to the visit, additional usage of staff PPE, and extensive cleaning of exam room while observing appropriate contact time as indicated for disinfecting solutions.   Joint pain /swelling right knee . Intermittently occurring  for the past 2 years .aggravated by exercise and prolonged standing on concrete floors. 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 The knee will periodically swell and feel warm to the touch. It will usually start hurting at work,  Ices it at home.  Usually  Resolves after a few days,  With tylenol and ice.   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 Cholecalciferol 1.25 MG (50000 UT) TABS 50,000 units PO qwk for 12 weeks. (Patient not taking: Reported on 09/13/2020) 12 tablet 0   No facility-administered medications prior to visit.    Review of Systems;  Patient denies headache, fevers, malaise, unintentional weight loss, skin rash, eye pain, sinus congestion and sinus pain, sore throat, dysphagia,  hemoptysis , cough, dyspnea, wheezing, chest pain, palpitations, orthopnea, edema, abdominal pain, nausea, melena, diarrhea, constipation, flank pain, dysuria, hematuria, urinary  Frequency, nocturia, numbness, tingling, seizures,  Focal weakness, Loss of consciousness,  Tremor, insomnia, depression, anxiety, and suicidal ideation.      Objective:   BP 120/70 (BP Location: Right Arm, Patient Position: Sitting)   Pulse 81   Temp 98.1 F (36.7 C)   Ht 5' 4.02" (1.626 m)   Wt 146 lb 3.2 oz (66.3 kg)   SpO2 99%   BMI 25.08 kg/m   BP Readings from Last 3 Encounters:  09/13/20 120/70  01/26/20 115/72  01/19/20 102/70    Wt Readings from Last 3 Encounters:  09/13/20 146 lb 3.2 oz (66.3 kg)  01/26/20 156 lb (70.8 kg) (85 %, Z= 1.04)*  01/19/20 152 lb 9.6 oz (69.2 kg) (83 %, Z= 0.94)*   * Growth percentiles are based on CDC (Girls, 2-20 Years) data.    General appearance: alert, cooperative and appears stated age Ears: normal TM's and external ear canals both ears Throat: lips, mucosa, and tongue normal; teeth and gums normal Neck: no adenopathy, no carotid bruit, supple, symmetrical, trachea midline and thyroid not enlarged, symmetric, no tenderness/mass/nodules Back: symmetric, no curvature. ROM normal. No CVA tenderness. Lungs: clear to auscultation bilaterally Heart: regular rate and rhythm, S1, S2 normal, no murmur, click, rub or gallop Abdomen: soft, non-tender; bowel sounds normal; no masses,  no organomegaly Pulses: 2+ and symmetric Skin: Skin color, texture, turgor normal. No rashes or lesions Lymph nodes: Cervical, supraclavicular, and axillary nodes normal.  Lab Results  Component Value Date   HGBA1C 4.9 11/17/2019    Lab Results  Component Value Date   CREATININE 0.70 11/17/2019    Lab Results  Component Value Date   WBC 6.4 11/17/2019   HGB 12.8 11/17/2019   HCT 38.5  11/17/2019   PLT 324.0 11/17/2019   GLUCOSE 87 11/17/2019   CHOL 192 11/17/2019   TRIG 89.0 11/17/2019   HDL 45.90 11/17/2019   LDLCALC 129 (H) 11/17/2019   ALT 11 11/17/2019   AST 14 11/17/2019   NA 139 11/17/2019   K 4.0 11/17/2019   CL 106 11/17/2019   CREATININE 0.70 11/17/2019   BUN 15 11/17/2019   CO2 26 11/17/2019   TSH 0.72 11/24/2019   HGBA1C 4.9 11/17/2019        Assessment & Plan:   Bursitis of right  knee Intermittent,  Aggravated by prolonged standing on concrete floors .  Knee exam is currently normal and plain films show no abnormalities or effusion.   Use of NSAIDS,  Tylenol and ice outlined.     Updated Medication List Outpatient Encounter Medications as of 09/13/2020  Medication Sig  . [DISCONTINUED] Cholecalciferol 1.25 MG (50000 UT) TABS 50,000 units PO qwk for 12 weeks. (Patient not taking: Reported on 09/13/2020)   No facility-administered encounter medications on file as of 09/13/2020.    I have discontinued Maida Sale. [employer] Cholecalciferol.  No orders of the defined types were placed in this encounter.   Medications Discontinued During This Encounter  Medication Reason  . Cholecalciferol 1.25 MG (50000 UT) TABS Error    Follow-up: No follow-ups on file.   Sherlene Shams, MD

## 2020-09-15 DIAGNOSIS — M7051 Other bursitis of knee, right knee: Secondary | ICD-10-CM | POA: Insufficient documentation

## 2020-09-15 NOTE — Assessment & Plan Note (Addendum)
Intermittent,  Aggravated by prolonged standing on concrete floors .  Knee exam is currently normal and plain films show no abnormalities or effusion.   Use of NSAIDS,  Tylenol and ice outlined.

## 2021-06-13 ENCOUNTER — Ambulatory Visit
Admission: RE | Admit: 2021-06-13 | Discharge: 2021-06-13 | Disposition: A | Discharge status: Home / Self Care | Payer: No Typology Code available for payment source | Source: Ambulatory Visit | Attending: Emergency Medicine | Admitting: Emergency Medicine

## 2021-06-13 ENCOUNTER — Other Ambulatory Visit: Payer: Self-pay

## 2021-06-13 VITALS — BP 125/88 | HR 130 | Temp 99.0°F | Resp 18

## 2021-06-13 DIAGNOSIS — J101 Influenza due to other identified influenza virus with other respiratory manifestations: Secondary | ICD-10-CM | POA: Diagnosis not present

## 2021-06-13 LAB — POCT INFLUENZA A/B
Influenza A, POC: POSITIVE — AB
Influenza B, POC: NEGATIVE

## 2021-06-13 NOTE — ED Triage Notes (Signed)
Pt here with flu-like sx x 3 days.

## 2021-06-13 NOTE — ED Provider Notes (Signed)
Name: JINNIE ONLEY Address: [address] [place] [address] MRN: [id number] DOB: 23-Aug-2000 Age: 20 y.o. Gender: female Encounter Date: 06/13/2021 Primary Provider: Sherlene Shams, MD  S: This is a 20 y.o. female with a 3 day history of flulike symptoms   +Myalgias and arthralgia.  +fatigue  + cough  + nasal congestion w/clear rhinorrhea  + fever;  + vomiting; - diarrhea  Flu exposure: unknown   The following portions of the patient's history were reviewed and updated in Epic as appropriate: allergies, current medications, past medical history, past social history, past surgical history and problem list.   O:   Vitals:   06/13/21 1507  BP: 125/88  Pulse: (!) 130  Resp: 18  Temp: 99 F (37.2 C)  SpO2: 96%   Gen: WDWN, NAD   HEENT:NCAT, EOMI, EACs clear, TMS clear bilaterally  Nares without discharge; +moderate mucosal erythema and edema  OP moist with mild to moderate posterior cobblestoning, no lesions noted Neck:  Supple, shotty anterior cervical lymphadenopathy  Chest: lungs clear to auscultation bilaterally, normal respiratory effort CV:    RRR, no murmur  Skin:  no rash Psychiatric: appropriate demeanor and responsiveness   Rapid flu: +  ASSESSMENT: Influenza A  PLAN: Outside of the timeframe for Tamiflu.  Symptomatic care with tylenol/motrin for pain or fevers;   Increased fluids as tolerated; humidifier use qhs prn   No work/school until 24 hours fever free  F/u with PCP if worsening, or is not improving    Amalia Greenhouse, FNP 06/13/21 1521

## 2021-06-13 NOTE — Discharge Instructions (Addendum)
You have been diagnosed with influenza A. This is typically a self-limiting virus that does not require antibiotics. Most people will have symptoms for about 7 - 10 days. Pay special attention to handwashing as this can prevent spread of the virus.   Always read the labels of cough and cold medications as they may contain some of the ingredients below.  Rest, push lots of fluids (especially water), and utilize supportive care for symptoms. You may take acetaminophen (Tylenol) every 4-6 hours and ibuprofen every 6-8 hours for muscle pain, joint pain, headaches (you may also alternate these medications). Mucinex (guaifenesin) may be taken over the counter for cough as needed can loosen phlegm. Please read the instructions and take as directed.  Sudafed (pseudophedrine) is sold behind the counter and can help reduce nasal pressure; avoid taking this if you have high blood pressure or feel jittery. Sudafed PE (phenylephrine) can be a helpful, short-term, over-the-counter alternative to limit side effects or if you have high blood pressure.  Flonase nasal spray can help alleviate congestion and sinus pressure. Many patients choose Afrin as a nasal decongestant; do not use for more than 3 days for risk of rebound (increased symptoms after stopping medication).  Saline nasal sprays or rinses can also help nasal congestion (use bottled or sterile water). Warm tea with lemon and honey can sooth sore throat and cough, as can cough drops.   Return to clinic for high fever not improving with medications, chest pain, difficulty breathing, non-stop vomiting, or coughing blood. Follow-up with your primary care provider if symptoms do not improve as expected in the next 5-7 days.

## 2021-06-17 DIAGNOSIS — J1089 Influenza due to other identified influenza virus with other manifestations: Secondary | ICD-10-CM | POA: Insufficient documentation

## 2021-06-17 DIAGNOSIS — F419 Anxiety disorder, unspecified: Secondary | ICD-10-CM | POA: Insufficient documentation

## 2021-07-29 NOTE — Progress Notes (Signed)
error 

## 2021-08-11 ENCOUNTER — Encounter: Payer: Self-pay | Admitting: Internal Medicine

## 2021-08-11 DIAGNOSIS — M7051 Other bursitis of knee, right knee: Secondary | ICD-10-CM

## 2022-03-07 DIAGNOSIS — N12 Tubulo-interstitial nephritis, not specified as acute or chronic: Secondary | ICD-10-CM | POA: Insufficient documentation

## 2022-03-11 ENCOUNTER — Ambulatory Visit
Admission: EM | Admit: 2022-03-11 | Discharge: 2022-03-11 | Disposition: A | Discharge status: Home / Self Care | Payer: No Typology Code available for payment source | Attending: Family Medicine | Admitting: Family Medicine

## 2022-03-11 ENCOUNTER — Encounter: Payer: Self-pay | Admitting: *Deleted

## 2022-03-11 ENCOUNTER — Other Ambulatory Visit: Payer: Self-pay

## 2022-03-11 DIAGNOSIS — Z20822 Contact with and (suspected) exposure to covid-19: Secondary | ICD-10-CM | POA: Insufficient documentation

## 2022-03-11 DIAGNOSIS — B349 Viral infection, unspecified: Secondary | ICD-10-CM | POA: Diagnosis not present

## 2022-03-11 DIAGNOSIS — R509 Fever, unspecified: Secondary | ICD-10-CM | POA: Diagnosis not present

## 2022-03-11 LAB — POCT URINALYSIS DIP (MANUAL ENTRY)
Glucose, UA: NEGATIVE mg/dL
Nitrite, UA: NEGATIVE
Protein Ur, POC: 100 mg/dL — AB
Spec Grav, UA: 1.015 (ref 1.010–1.025)
Urobilinogen, UA: 2 E.U./dL — AB
pH, UA: 6 (ref 5.0–8.0)

## 2022-03-11 LAB — POCT URINE PREGNANCY: Preg Test, Ur: NEGATIVE

## 2022-03-11 LAB — SARS CORONAVIRUS 2 BY RT PCR: SARS Coronavirus 2 by RT PCR: NEGATIVE

## 2022-03-11 NOTE — ED Provider Notes (Signed)
[facility]    CSN: [id number] Arrival date & time: 03/11/22  1740      History   Chief Complaint Chief Complaint  Patient presents with   Emesis   Nausea    HPI Sylvia Meadows is a 21 y.o. female.   HPI Patient here for evaluation nausea, vomiting which developed 4 days ago. She has not vomited today. Endorses associated symptoms of fatigue, poor appetite, nausea, and dizziness.  She and endorses poor fluid intake over the course of this illness as she has been lying around over the last few days.  She reports she feels symptoms are somewhat improving however she still has some mild nausea.  She was able to tolerate a small amount food today.  She has no associated URI or UTI symptoms. 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      Home Medications    Prior to Admission medications   Not on File    Family History Family History  Problem Relation Age of Onset   Hyperlipidemia Mother    Diabetes Mother    Mental illness Mother    Depression Mother    Alcohol abuse Father    Asthma Sister    Depression Maternal Aunt    Anxiety disorder Maternal Aunt    Anxiety disorder Maternal Grandfather    Depression Maternal Grandfather    Hyperlipidemia Maternal  Grandfather    Hypertension Maternal Grandfather    Depression Paternal Grandmother    Anxiety disorder Paternal Grandmother    Arthritis Maternal Grandmother    Mental illness Maternal Grandmother    Tremor Neg Hx     Social History Social History   Tobacco Use   Smoking status: Never   Smokeless tobacco: Never  Vaping Use   Vaping Use: Never used  Substance Use Topics   Alcohol use: No   Drug use: No     Allergies   Patient has no known allergies.   Review of Systems Review of Systems Pertinent negatives listed in HPI   Physical Exam Triage Vital Signs ED Triage Vitals [03/11/22 1657]  Enc Vitals Group     BP 121/86     Pulse Rate (!) 135     Resp 18     Temp 98.3 F (36.8 C)     Temp Source Oral     SpO2 98 %     Weight      Height      Head Circumference      Peak Flow      Pain Score      Pain Loc      Pain Edu?      Excl. in GC?  No data found.  Updated Vital Signs BP 121/86 (BP Location: Left Arm)   Pulse (!) 135   Temp 98.3 F (36.8 C) (Oral)   Resp 18   LMP 02/25/2022   SpO2 98%   Visual Acuity Right Eye Distance:   Left Eye Distance:   Bilateral Distance:    Right Eye Near:   Left Eye Near:    Bilateral Near:     Physical Exam General appearance: Alert, well developed, well nourished, cooperative and  no distress Head: Normocephalic, without obvious abnormality, atraumatic Heart: rate and rhythm normal. No gallop or murmurs noted on exam  Respiratory: Respirations even and unlabored, normal respiratory rate Abdomen: BS +, no distention, no rebound tenderness, or no mass Skin: Skin color, texture, turgor normal. No rashes seen  Neurologic: No obvious abnormal neurological deficit present on exam Psych: Appropriate mood and affect.  UC Treatments / Results  Labs (all labs ordered are listed, but only abnormal results are displayed) Labs Reviewed  POCT URINALYSIS DIP (MANUAL ENTRY) - Abnormal; Notable for the following  components:      Result Value   Clarity, UA cloudy (*)    Bilirubin, UA small (*)    Ketones, POC UA trace (5) (*)    Blood, UA moderate (*)    Protein Ur, POC =100 (*)    Urobilinogen, UA 2.0 (*)    Leukocytes, UA Large (3+) (*)    All other components within normal limits  URINE CULTURE  SARS CORONAVIRUS 2 BY RT PCR  POCT URINE PREGNANCY    EKG   Radiology No results found.  Procedures Procedures (including critical care time)  Medications Ordered in UC Medications - No data to display  Initial Impression / Assessment and Plan / UC Course  I have reviewed the triage vital signs and the nursing notes.  Pertinent labs & imaging results that were available during my care of the patient were reviewed by me and considered in my medical decision making (see chart for details).    Symptoms present for nearly 5 days with improvement. Patient needs COVID test to return to work. She declined oral antinausea medication. UA abnormal, urine culture ordered. Return precautions given.  Final Clinical Impressions(s) / UC Diagnoses   Final diagnoses:  Febrile illness  Viral illness     Discharge Instructions      COVID test will result within 4-6 hours. Urine culture will result within 2-3 days.  Hydrate well with fluids. Eat bland foods and resume normal diet as tolerated.      ED Prescriptions   None    PDMP not reviewed this encounter.   Bing Neighbors, FNP 03/11/22 1746

## 2022-03-11 NOTE — Discharge Instructions (Addendum)
COVID test will result within 4-6 hours. Urine culture will result within 2-3 days.  Hydrate well with fluids. Eat bland foods and resume normal diet as tolerated.

## 2022-03-11 NOTE — ED Triage Notes (Signed)
Pt reports Nausea and vomiting started on SAt. night

## 2022-03-12 ENCOUNTER — Other Ambulatory Visit: Payer: Self-pay

## 2022-03-12 ENCOUNTER — Emergency Department
Admission: EM | Admit: 2022-03-12 | Discharge: 2022-03-12 | Disposition: A | Discharge status: Home / Self Care | Payer: No Typology Code available for payment source | Attending: Emergency Medicine | Admitting: Emergency Medicine

## 2022-03-12 ENCOUNTER — Emergency Department: Payer: No Typology Code available for payment source

## 2022-03-12 ENCOUNTER — Encounter: Payer: Self-pay | Admitting: Emergency Medicine

## 2022-03-12 ENCOUNTER — Telehealth: Payer: Self-pay

## 2022-03-12 DIAGNOSIS — N12 Tubulo-interstitial nephritis, not specified as acute or chronic: Secondary | ICD-10-CM | POA: Diagnosis not present

## 2022-03-12 DIAGNOSIS — R109 Unspecified abdominal pain: Secondary | ICD-10-CM | POA: Diagnosis present

## 2022-03-12 DIAGNOSIS — R5383 Other fatigue: Secondary | ICD-10-CM | POA: Diagnosis not present

## 2022-03-12 LAB — POC URINE PREG, ED: Preg Test, Ur: NEGATIVE

## 2022-03-12 LAB — URINALYSIS, ROUTINE W REFLEX MICROSCOPIC
Bilirubin Urine: NEGATIVE
Glucose, UA: NEGATIVE mg/dL
Ketones, ur: 5 mg/dL — AB
Nitrite: NEGATIVE
Protein, ur: NEGATIVE mg/dL
Specific Gravity, Urine: 1.016 (ref 1.005–1.030)
WBC, UA: >50 WBC/hpf — ABNORMAL HIGH (ref 0–5)
pH: 6 (ref 5.0–8.0)

## 2022-03-12 LAB — CBC WITH DIFFERENTIAL/PLATELET
Abs Immature Granulocytes: 0.01 10*3/uL (ref 0.00–0.07)
Basophils Absolute: 0 10*3/uL (ref 0.0–0.1)
Basophils Relative: 0 %
Eosinophils Absolute: 0 10*3/uL (ref 0.0–0.5)
Eosinophils Relative: 0 %
HCT: 37.9 % (ref 36.0–46.0)
Hemoglobin: 12.5 g/dL (ref 12.0–15.0)
Immature Granulocytes: 0 %
Lymphocytes Relative: 13 %
Lymphs Abs: 1.1 10*3/uL (ref 0.7–4.0)
MCH: 29.4 pg (ref 26.0–34.0)
MCHC: 33 g/dL (ref 30.0–36.0)
MCV: 89.2 fL (ref 80.0–100.0)
Monocytes Absolute: 0.9 10*3/uL (ref 0.1–1.0)
Monocytes Relative: 11 %
Neutro Abs: 5.9 10*3/uL (ref 1.7–7.7)
Neutrophils Relative %: 76 %
Platelets: 296 10*3/uL (ref 150–400)
RBC: 4.25 MIL/uL (ref 3.87–5.11)
RDW: 12.1 % (ref 11.5–15.5)
WBC: 7.9 10*3/uL (ref 4.0–10.5)
nRBC: 0 % (ref 0.0–0.2)

## 2022-03-12 LAB — COMPREHENSIVE METABOLIC PANEL
ALT: 10 U/L (ref 0–44)
AST: 11 U/L — ABNORMAL LOW (ref 15–41)
Albumin: 3.9 g/dL (ref 3.5–5.0)
Alkaline Phosphatase: 57 U/L (ref 38–126)
Anion gap: 10 (ref 5–15)
BUN: 11 mg/dL (ref 6–20)
CO2: 24 mmol/L (ref 22–32)
Calcium: 8.9 mg/dL (ref 8.9–10.3)
Chloride: 102 mmol/L (ref 98–111)
Creatinine, Ser: 0.76 mg/dL (ref 0.44–1.00)
GFR, Estimated: >60 mL/min (ref >60)
Glucose, Bld: 100 mg/dL — ABNORMAL HIGH (ref 70–99)
Potassium: 3.5 mmol/L (ref 3.5–5.1)
Sodium: 136 mmol/L (ref 135–145)
Total Bilirubin: 0.7 mg/dL (ref 0.3–1.2)
Total Protein: 7.6 g/dL (ref 6.5–8.1)

## 2022-03-12 LAB — LACTIC ACID, PLASMA: Lactic Acid, Venous: 0.8 mmol/L (ref 0.5–1.9)

## 2022-03-12 MED ORDER — IOHEXOL 300 MG/ML  SOLN
100.0000 mL | Freq: Once | INTRAMUSCULAR | Status: AC | PRN
  Administered 2022-03-12: 100 mL via INTRAVENOUS

## 2022-03-12 MED ORDER — SODIUM CHLORIDE 0.9 % IV BOLUS
1000.0000 mL | Freq: Once | INTRAVENOUS | Status: AC
  Administered 2022-03-12: 1000 mL via INTRAVENOUS

## 2022-03-12 MED ORDER — CEFDINIR 300 MG PO CAPS
300.0000 mg | ORAL_CAPSULE | Freq: Two times a day (BID) | ORAL | 0 refills | Status: AC
Start: 2022-03-12 — End: 2022-03-22

## 2022-03-12 MED ORDER — KETOROLAC TROMETHAMINE 15 MG/ML IJ SOLN
15.0000 mg | Freq: Once | INTRAMUSCULAR | Status: AC
Start: 2022-03-12 — End: 2022-03-12
  Administered 2022-03-12: 15 mg via INTRAVENOUS
  Filled 2022-03-12: qty 1

## 2022-03-12 MED ORDER — SODIUM CHLORIDE 0.9 % IV SOLN
1.0000 g | INTRAVENOUS | Status: DC
  Administered 2022-03-12: 1 g via INTRAVENOUS
  Filled 2022-03-12: qty 10

## 2022-03-12 NOTE — Discharge Instructions (Addendum)
Take the antibiotics as prescribed for your kidney infection.  You may continue to take Tylenol/ibuprofen per package instructions to help with your symptoms.  Please return if you develop fever, shaking chills, vomiting, worsening pain, or any new, worsening, or change in symptoms or other concerns. You should have your imaging rechecked to ensure the abnormal findings have returned to normal

## 2022-03-12 NOTE — ED Triage Notes (Signed)
Pt coming pov from home with fever and back pain. Pt started feeling sick on Saturday with chills, sweats, fever and central back pain. Pt was tested for covid which was negative.   No tylenol or ibuprofen taken today. Pt denies urinary symptoms.

## 2022-03-12 NOTE — ED Provider Notes (Signed)
[facility] Provider Note    Event Date/Time   First MD Initiated Contact with Patient 03/12/22 1329     (approximate)   History   Fever and Back Pain   HPI  Sylvia Meadows is a 21 y.o. female who presents today for evaluation of generalized fatigue, back pain, and fever.  Patient reports that her symptoms began on Saturday, 5 days prior to arrival.  She reports that she had 1 episode of vomiting on Monday.  She denies any abdominal pain.  She reports that her pain in her back is right-sided.  She thinks that her fever has resolved but she still feels cold.  She has not had any urinary symptoms.  She went to urgent care yesterday who tested her for COVID and this was negative.  She denies being on any antibiotics.  Patient denies vaginal discharge or bleeding.  She denies any concern for sexually transmitted disease.  Denies ever having been sexually active.  Patient Active Problem List   Diagnosis Date Noted   Bursitis of right knee 09/15/2020   Acute pain of right knee 01/19/2020   MDD (major depressive disorder), recurrent episode, mild (HCC) 11/19/2019   Tremors of nervous system 11/19/2019   Acne 11/19/2019   Low vitamin D level 11/19/2019   Anxiety and depression 08/26/2018   Obsessive-compulsive disorder 08/26/2018         Physical Exam   Triage Vital Signs: ED Triage Vitals  Enc Vitals Group     BP 03/12/22 1243 125/79     Pulse Rate 03/12/22 1243 (!) 122     Resp 03/12/22 1244 20     Temp 03/12/22 1243 98.9 F (37.2 C)     Temp Source 03/12/22 1243 Oral     SpO2 03/12/22 1243 99 %     Weight 03/12/22 1245 150 lb (68 kg)     Height 03/12/22 1324 5\' 4"  (1.626 m)     Head Circumference --      Peak Flow --      Pain Score 03/12/22 1244 3     Pain Loc --      Pain Edu? --      Excl. in GC? --     Most recent vital signs: Vitals:   03/12/22 1244 03/12/22 1552  BP:  120/80  Pulse:  99  Resp: 20 18  Temp:  98.3 F (36.8 C)  SpO2:   99%    Physical Exam Vitals and nursing note reviewed.  Constitutional:      General: Awake and alert. No acute distress.    Appearance: Normal appearance. The patient is normal weight.  HENT:     Head: Normocephalic and atraumatic.     Mouth: Mucous membranes are moist.  Eyes:     General: PERRL. Normal EOMs        Right eye: No discharge.        Left eye: No discharge.     Conjunctiva/sclera: Conjunctivae normal.  Cardiovascular:     Rate and Rhythm: Tachycardic rate and regular rhythm.     Pulses: Normal pulses.     Heart sounds: Normal heart sounds Pulmonary:     Effort: Pulmonary effort is normal. No respiratory distress.     Breath sounds: Normal breath sounds.  Abdominal:     Abdomen is soft. There is no abdominal tenderness. No rebound or guarding. No distention. Right sided CVAT  Back: No midline tenderness. Strength and sensation 5/5 to  bilateral lower extremities. Normal great toe extension against resistance. Normal sensation throughout feet. Normal patellar reflexes. Negative SLR and opposite SLR bilaterally. Negative FABER test Musculoskeletal:        General: No swelling. Normal range of motion.     Cervical back: Normal range of motion and neck supple.  Skin:    General: Skin is warm and dry.     Capillary Refill: Capillary refill takes less than 2 seconds.     Findings: No rash.  Neurological:     Mental Status: The patient is awake and alert.      ED Results / Procedures / Treatments   Labs (all labs ordered are listed, but only abnormal results are displayed) Labs Reviewed  URINALYSIS, ROUTINE W REFLEX MICROSCOPIC - Abnormal; Notable for the following components:      Result Value   Color, Urine YELLOW (*)    APPearance CLOUDY (*)    Hgb urine dipstick MODERATE (*)    Ketones, ur 5 (*)    Leukocytes,Ua LARGE (*)    WBC, UA >50 (*)    Bacteria, UA MANY (*)    Non Squamous Epithelial PRESENT (*)    All other components within normal limits   COMPREHENSIVE METABOLIC PANEL - Abnormal; Notable for the following components:   Glucose, Bld 100 (*)    AST 11 (*)    All other components within normal limits  CULTURE, BLOOD (ROUTINE X 2)  CULTURE, BLOOD (ROUTINE X 2)  CBC WITH DIFFERENTIAL/PLATELET  LACTIC ACID, PLASMA  POC URINE PREG, ED     EKG     RADIOLOGY I independently reviewed and interpreted imaging and agree with radiologists findings.     PROCEDURES:  Critical Care performed:   Procedures   MEDICATIONS ORDERED IN ED: Medications  cefTRIAXone (ROCEPHIN) 1 g in sodium chloride 0.9 % 100 mL IVPB (0 g Intravenous Stopped 03/12/22 1655)  sodium chloride 0.9 % bolus 1,000 mL (0 mLs Intravenous Stopped 03/12/22 1700)  ketorolac (TORADOL) 15 MG/ML injection 15 mg (15 mg Intravenous Given 03/12/22 1548)  iohexol (OMNIPAQUE) 300 MG/ML solution 100 mL (100 mLs Intravenous Contrast Given 03/12/22 1555)     IMPRESSION / MDM / ASSESSMENT AND PLAN / ED COURSE  I reviewed the triage vital signs and the nursing notes.   Differential diagnosis includes, but is not limited to, UTI/pyelonephritis, appendicitis, nephrolithiasis.  Patient is awake and alert, tachycardic to 122 though afebrile and normotensive.  She has CVA tenderness.  Urinalysis obtained in triage demonstrates moderate blood, many white blood cells, white blood cell clumps, consistent with possible UTI.  Given her back pain and CVA tenderness, most likely pyelonephritis.   Patient was given a dose of Rocephin in the emergency department, as well as normal saline.  Lactate is normal, no leukocytosis, no fever without antipyretics today, do not suspect sepsis at this time.  Blood cultures were sent however.  CT scan obtained to ensure no nephrolithiasis or other etiology of her symptoms today, and demonstrates right-sided pyelonephritis only.  She was given 10 days of p.o. antibiotics.  However, underlying mass lesion not fully excluded.  I recommended that they get  repeat imaging or a renal ultrasound once her symptoms resolve for further evaluation of this.  Patient and mother understand and agree with plan.  She was discharged in stable condition.  Patient's presentation is most consistent with acute complicated illness / injury requiring diagnostic workup.       FINAL CLINICAL IMPRESSION(S) / ED DIAGNOSES  Final diagnoses:  Pyelonephritis     Rx / DC Orders   ED Discharge Orders          Ordered    cefdinir (OMNICEF) 300 MG capsule  2 times daily        03/12/22 1644             Note:  This document was prepared using Dragon voice recognition software and may include unintentional dictation errors.   Keturah Shavers 03/12/22 1805    Chesley Noon, MD 03/12/22 1946

## 2022-03-14 LAB — URINE CULTURE
Culture: 20000 — AB
Special Requests: NORMAL

## 2022-03-17 LAB — CULTURE, BLOOD (ROUTINE X 2)
Culture: NO GROWTH
Culture: NO GROWTH
Special Requests: ADEQUATE
Special Requests: ADEQUATE

## 2022-03-30 ENCOUNTER — Other Ambulatory Visit (HOSPITAL_COMMUNITY)
Admission: RE | Admit: 2022-03-30 | Discharge: 2022-03-30 | Disposition: A | Discharge status: Home / Self Care | Payer: No Typology Code available for payment source | Source: Ambulatory Visit | Attending: Internal Medicine | Admitting: Internal Medicine

## 2022-03-30 ENCOUNTER — Ambulatory Visit (INDEPENDENT_AMBULATORY_CARE_PROVIDER_SITE_OTHER): Payer: No Typology Code available for payment source | Admitting: Internal Medicine

## 2022-03-30 ENCOUNTER — Encounter: Payer: Self-pay | Admitting: Internal Medicine

## 2022-03-30 VITALS — BP 110/76 | HR 83 | Temp 97.7°F | Ht 64.0 in | Wt 144.2 lb

## 2022-03-30 DIAGNOSIS — N12 Tubulo-interstitial nephritis, not specified as acute or chronic: Secondary | ICD-10-CM

## 2022-03-30 DIAGNOSIS — R310 Gross hematuria: Secondary | ICD-10-CM

## 2022-03-30 LAB — URINALYSIS, ROUTINE W REFLEX MICROSCOPIC
Bilirubin Urine: NEGATIVE
Ketones, ur: NEGATIVE
Nitrite: NEGATIVE
Specific Gravity, Urine: >=1.03 — AB (ref 1.000–1.030)
Urine Glucose: NEGATIVE
Urobilinogen, UA: 0.2 (ref 0.0–1.0)
pH: 6 (ref 5.0–8.0)

## 2022-03-30 LAB — CBC WITH DIFFERENTIAL/PLATELET
Basophils Absolute: 0.1 10*3/uL (ref 0.0–0.1)
Basophils Relative: 1 % (ref 0.0–3.0)
Eosinophils Absolute: 0.1 10*3/uL (ref 0.0–0.7)
Eosinophils Relative: 1.6 % (ref 0.0–5.0)
HCT: 39.7 % (ref 36.0–46.0)
Hemoglobin: 13.3 g/dL (ref 12.0–15.0)
Lymphocytes Relative: 38.4 % (ref 12.0–46.0)
Lymphs Abs: 2.7 10*3/uL (ref 0.7–4.0)
MCHC: 33.4 g/dL (ref 30.0–36.0)
MCV: 91.5 fl (ref 78.0–100.0)
Monocytes Absolute: 0.5 10*3/uL (ref 0.1–1.0)
Monocytes Relative: 7.2 % (ref 3.0–12.0)
Neutro Abs: 3.6 10*3/uL (ref 1.4–7.7)
Neutrophils Relative %: 51.8 % (ref 43.0–77.0)
Platelets: 357 10*3/uL (ref 150.0–400.0)
RBC: 4.34 Mil/uL (ref 3.87–5.11)
RDW: 13.5 % (ref 11.5–15.5)
WBC: 7 10*3/uL (ref 4.0–10.5)

## 2022-03-30 LAB — COMPREHENSIVE METABOLIC PANEL
ALT: 10 U/L (ref 0–35)
AST: 10 U/L (ref 0–37)
Albumin: 4.6 g/dL (ref 3.5–5.2)
Alkaline Phosphatase: 50 U/L (ref 39–117)
BUN: 14 mg/dL (ref 6–23)
CO2: 28 mEq/L (ref 19–32)
Calcium: 9.6 mg/dL (ref 8.4–10.5)
Chloride: 104 mEq/L (ref 96–112)
Creatinine, Ser: 0.81 mg/dL (ref 0.40–1.20)
GFR: 103.55 mL/min (ref >60.00)
Glucose, Bld: 86 mg/dL (ref 70–99)
Potassium: 3.9 mEq/L (ref 3.5–5.1)
Sodium: 138 mEq/L (ref 135–145)
Total Bilirubin: 0.5 mg/dL (ref 0.2–1.2)
Total Protein: 6.9 g/dL (ref 6.0–8.3)

## 2022-03-30 LAB — POCT PREGNANCY, URINE

## 2022-03-30 LAB — C-REACTIVE PROTEIN: CRP: <1 mg/dL (ref 0.5–20.0)

## 2022-03-30 NOTE — Progress Notes (Signed)
Subjective:  Patient ID: Sylvia Meadows, female    DOB: November 21, 2000  Age: 21 y.o. MRN: [id number]  CC: The primary encounter diagnosis was Pyelonephritis of right kidney. A diagnosis of Hematuria, gross was also pertinent to this visit.   HPI Sylvia Meadows presents for  Chief Complaint  Patient presents with   Follow-up    ER follow up    Treated at urgent care on august 8 for fever, chills, nausea and vomiting for the past 5 to 7 days.  Was having right sided back pain but "I was too out of it" to mention the back pain, so she was tested for COVID and sent for home with care for a nonspecific viral l infection .  Her mother made her go to the ER the following day,  where she was tested and scanned and diagnosed with right sided pyelo  was given ceftriaxone IV,  sent home with an rx for cefidinir x  10 days ,  which made her feel better while on the antibiotics,  but her back continues to hurt intermittently (previously it was constant).  No nausea , chills,   but still having blood in urine.  Period was due August 8 ; it was abnormally light, so initially she was unsure  she had  one,  but she does recall seeing a blood flow between bladder voids  and she has had other symptoms usually associated with her period ( irritability).  She does not use tampons., has  never had sexual  intercourse , and no prior  pelvic exam with speculum. .     No outpatient medications prior to visit.   No facility-administered medications prior to visit.    Review of Systems;  Patient denies headache, fevers, malaise, unintentional weight loss, skin rash, eye pain, sinus congestion and sinus pain, sore throat, dysphagia,  hemoptysis , cough, dyspnea, wheezing, chest pain, palpitations, orthopnea, edema, abdominal pain, nausea, melena, diarrhea, constipation, flank pain, dysuria, hematuria, urinary  Frequency, nocturia, numbness, tingling, seizures,  Focal weakness, Loss of consciousness,  Tremor, insomnia,  depression, anxiety, and suicidal ideation.      Objective:  BP 110/76 (BP Location: Left Arm, Patient Position: Sitting, Cuff Size: Normal)   Pulse 83   Temp 97.7 F (36.5 C) (Oral)   Ht 5\' 4"  (1.626 m)   Wt 144 lb 3.2 oz (65.4 kg)   LMP 02/25/2022   SpO2 99%   BMI 24.75 kg/m   BP Readings from Last 3 Encounters:  03/30/22 110/76  03/12/22 120/80  03/11/22 121/86    Wt Readings from Last 3 Encounters:  03/30/22 144 lb 3.2 oz (65.4 kg)  03/12/22 149 lb 14.6 oz (68 kg)  09/13/20 146 lb 3.2 oz (66.3 kg)    General appearance: alert, cooperative and appears stated age Ears: normal TM's and external ear canals both ears Throat: lips, mucosa, and tongue normal; teeth and gums normal Neck: no adenopathy, no carotid bruit, supple, symmetrical, trachea midline and thyroid not enlarged, symmetric, no tenderness/mass/nodules Back: symmetric, no curvature. ROM normal. Right CVA tenderness to percussion . Lungs: clear to auscultation bilaterally Heart: regular rate and rhythm, S1, S2 normal, no murmur, click, rub or gallop Abdomen: soft, non-tender; bowel sounds normal; no masses,  no organomegaly Pulses: 2+ and symmetric Skin: Skin color, texture, turgor normal. No rashes or lesions Lymph nodes: Cervical, supraclavicular, and axillary nodes normal.  Lab Results  Component Value Date   HGBA1C 4.9 11/17/2019    Lab  Results  Component Value Date   CREATININE 0.76 03/12/2022   CREATININE 0.70 11/17/2019    Lab Results  Component Value Date   WBC 7.9 03/12/2022   HGB 12.5 03/12/2022   HCT 37.9 03/12/2022   PLT 296 03/12/2022   GLUCOSE 100 (H) 03/12/2022   CHOL 192 11/17/2019   TRIG 89.0 11/17/2019   HDL 45.90 11/17/2019   LDLCALC 129 (H) 11/17/2019   ALT 10 03/12/2022   AST 11 (L) 03/12/2022   NA 136 03/12/2022   K 3.5 03/12/2022   CL 102 03/12/2022   CREATININE 0.76 03/12/2022   BUN 11 03/12/2022   CO2 24 03/12/2022   TSH 0.72 11/24/2019   HGBA1C 4.9 11/17/2019     CT ABDOMEN PELVIS W CONTRAST  Result Date: 03/12/2022 CLINICAL DATA:  Pt coming pov from home with fever and back pain. Pt started feeling sick on Saturday with chills, sweats, fever and central back pain. Pt was tested for covid which was negative. Flank pain, kidney stone suspected UTI, recurrent/complicated (Female) back pain, fever EXAM: CT ABDOMEN AND PELVIS WITH CONTRAST TECHNIQUE: Multidetector CT imaging of the abdomen and pelvis was performed using the standard protocol following bolus administration of intravenous contrast. RADIATION DOSE REDUCTION: This exam was performed according to the departmental dose-optimization program which includes automated exposure control, adjustment of the mA and/or kV according to patient size and/or use of iterative reconstruction technique. CONTRAST:  OMNIPAQUE IOHEXOL 300 MG/ML  SOLN COMPARISON:  None Available. FINDINGS: Lower chest: No acute abnormality. Hepatobiliary: No focal liver abnormality. No gallstones, gallbladder wall thickening, or pericholecystic fluid. No biliary dilatation. Pancreas: No focal lesion. Normal pancreatic contour. No surrounding inflammatory changes. No main pancreatic ductal dilatation. Spleen: Normal in size without focal abnormality. Adrenals/Urinary Tract: No adrenal nodule bilaterally. Inferior pole of the right kidney poorly enhances with associated edema. Query underlying subcentimeter hypodensity. Otherwise bilateral kidneys enhance symmetrically. No nephroureterolithiasis bilaterally. No hydronephrosis. No hydroureter. The urinary bladder is unremarkable. Stomach/Bowel: Stomach is within normal limits. No evidence of bowel wall thickening or dilatation. Appendix appears normal. Vascular/Lymphatic: No abdominal aorta or iliac aneurysm. No abdominal, pelvic, or inguinal lymphadenopathy. Reproductive: Uterus and bilateral adnexa are unremarkable. Other: No intraperitoneal free fluid. No intraperitoneal free gas. No  organized fluid collection. Musculoskeletal: No abdominal wall hernia or abnormality. No suspicious lytic or blastic osseous lesions. No acute displaced fracture. IMPRESSION: Focal pyelonephritis of the inferior right renal pole with underlying mass lesion not fully excluded. No associated nephroureterolithiasis. Correlate with urinalysis. Consider nonemergent renal ultrasound. Electronically Signed   By: Tish Frederickson M.D.   On: 03/12/2022 16:14    Assessment & Plan:   Problem List Items Addressed This Visit     Hematuria, gross    She was treated for pyelonephritis of the right kidney on August 9 when she presented with a history of right sided CVA pain ,  Fevers,  N/V hematuria  with NO PRECEDENT URINARY SYMPTOMS of FREQUENCY OR DYSURIA. Marland Kitchen She continues to have hematuria and back pain despite empiric treatment with cefdinir x 10 days, ending on August 20.  Previous CT noted edema involving the lower half ot he right kidney, with mass /lesion not ruled out.  Repeat CT is needed to evaluate right kidney and rule out abscess       Relevant Orders   Urine cytology ancillary only([place])   Other Visit Diagnoses     Pyelonephritis of right kidney    -  Primary   Relevant  Orders   POCT Pregnancy, Urine   Urinalysis, Routine w reflex microscopic   Urine Culture   C-reactive protein   CBC with Differential/Platelet   Comprehensive metabolic panel   CT Abdomen Pelvis W Contrast       I spent a total of  30 minutes with this patient in a face to face visit on the date of this encounter reviewing yer Urget[facility] visit on August 8 .  ER visit on August 9. Including the labs and  recent imaging study ,   and post visit ordering of testing and therapeutics.    Follow-up: No follow-ups on file.   Sherlene Shams, MD

## 2022-03-30 NOTE — Patient Instructions (Signed)
I am concerned that your kidney infection has not resolved  The labs and CT are being repeated

## 2022-03-30 NOTE — Assessment & Plan Note (Signed)
She was treated for pyelonephritis of the right kidney on August 9 when she presented with a history of right sided CVA pain ,  Fevers,  N/V hematuria  with NO PRECEDENT URINARY SYMPTOMS of FREQUENCY OR DYSURIA. Marland Kitchen She continues to have hematuria and back pain despite empiric treatment with cefdinir x 10 days, ending on August 20.  Previous CT noted edema involving the lower half ot he right kidney, with mass /lesion not ruled out.  Repeat CT is needed to evaluate right kidney and rule out abscess

## 2022-03-31 LAB — URINE CULTURE
MICRO NUMBER:: 13840443
Result:: NO GROWTH
SPECIMEN QUALITY:: ADEQUATE

## 2022-03-31 LAB — URINE CYTOLOGY ANCILLARY ONLY
Chlamydia: NEGATIVE
Comment: NEGATIVE
Comment: NORMAL
Neisseria Gonorrhea: NEGATIVE

## 2022-04-01 ENCOUNTER — Ambulatory Visit: Payer: No Typology Code available for payment source | Admitting: Internal Medicine

## 2022-04-01 ENCOUNTER — Ambulatory Visit
Admission: RE | Admit: 2022-04-01 | Discharge: 2022-04-01 | Disposition: A | Discharge status: Home / Self Care | Payer: No Typology Code available for payment source | Source: Ambulatory Visit | Attending: Internal Medicine | Admitting: Internal Medicine

## 2022-04-01 DIAGNOSIS — N12 Tubulo-interstitial nephritis, not specified as acute or chronic: Secondary | ICD-10-CM | POA: Insufficient documentation

## 2022-04-01 MED ORDER — IOHEXOL 300 MG/ML  SOLN
100.0000 mL | Freq: Once | INTRAMUSCULAR | Status: AC | PRN
  Administered 2022-04-01: 100 mL via INTRAVENOUS

## 2022-04-02 ENCOUNTER — Encounter: Payer: Self-pay | Admitting: Internal Medicine

## 2022-06-16 ENCOUNTER — Ambulatory Visit (INDEPENDENT_AMBULATORY_CARE_PROVIDER_SITE_OTHER): Payer: No Typology Code available for payment source | Admitting: Internal Medicine

## 2022-06-16 ENCOUNTER — Ambulatory Visit (INDEPENDENT_AMBULATORY_CARE_PROVIDER_SITE_OTHER): Payer: No Typology Code available for payment source

## 2022-06-16 ENCOUNTER — Encounter: Payer: Self-pay | Admitting: Internal Medicine

## 2022-06-16 VITALS — BP 110/76 | HR 92 | Temp 98.4°F | Ht 64.0 in | Wt 144.2 lb

## 2022-06-16 DIAGNOSIS — G8929 Other chronic pain: Secondary | ICD-10-CM

## 2022-06-16 DIAGNOSIS — M545 Low back pain, unspecified: Secondary | ICD-10-CM | POA: Diagnosis not present

## 2022-06-16 LAB — POCT URINE PREGNANCY: Preg Test, Ur: NEGATIVE

## 2022-06-16 NOTE — Progress Notes (Signed)
Subjective:  Patient ID: Sylvia Meadows, female    DOB: Dec 29, 2000  Age: 21 y.o. MRN: [id number]  CC: The encounter diagnosis was Chronic right-sided low back pain without sciatica.   HPI Sylvia Meadows presents for  Chief Complaint  Patient presents with   Follow-up    Follow up since having polynephritis. Pt stated that she is still having the same back pain.     [name] was treated for right sided pyelonephritis in August , but continues to report low back on the right side since August ,  repeat Urine studies and CT done  in late August confirmed resolution of infection and no abscess.  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 30 hours weekly.  No heavy lifting.  Stands mostly at a counter and bends to the side to retrieve things from the refrigerator.  The pain is not variable  and is present 24/7 . It is aggravated by lying on her  side and  improves if she lies supine with  knees bent .  She takes  naproxen for headaches,  but notes that the only medication that temporarily relieves her pain is tylenol .  She has no known history of scoliosis, prior back injury or history of participation in contact sports or  cheerleading,   No outpatient medications prior to visit.   No facility-administered medications prior to visit.    Review of Systems;  Patient denies headache, fevers, malaise, unintentional weight loss, skin rash, eye pain, sinus congestion and sinus pain, sore throat, dysphagia,  hemoptysis , cough, dyspnea, wheezing, chest pain, palpitations, orthopnea, edema, abdominal pain, nausea, melena, diarrhea, constipation, flank pain, dysuria, hematuria, urinary  Frequency, nocturia, numbness, tingling, seizures,  Focal weakness, Loss of consciousness,  Tremor, insomnia, depression, anxiety, and suicidal ideation.      Objective:  BP 110/76 (BP Location: Left Arm, Patient Position: Sitting, Cuff Size: Normal)   Pulse 92   Temp 98.4 F (36.9 C) (Oral)   Ht 5\' 4"  (1.626 m)   Wt 144  lb 3.2 oz (65.4 kg)   SpO2 99%   BMI 24.75 kg/m   BP Readings from Last 3 Encounters:  06/16/22 110/76  03/30/22 110/76  03/12/22 120/80    Wt Readings from Last 3 Encounters:  06/16/22 144 lb 3.2 oz (65.4 kg)  03/30/22 144 lb 3.2 oz (65.4 kg)  03/12/22 149 lb 14.6 oz (68 kg)    General appearance: alert, cooperative and appears stated age Ears: normal TM's and external ear canals both ears Throat: lips, mucosa, and tongue normal; teeth and gums normal Neck: no adenopathy, no carotid bruit, supple, symmetrical, trachea midline and thyroid not enlarged, symmetric, no tenderness/mass/nodules Back: symmetric, no curvature. ROM normal. No CVA tenderness. Lungs: clear to auscultation bilaterally Heart: regular rate and rhythm, S1, S2 normal, no murmur, click, rub or gallop Abdomen: soft, non-tender; bowel sounds normal; no masses,  no organomegaly Pulses: 2+ and symmetric Back: spine is nontender  Her  pain is localized to 1 inch above the SI joint on the right  Skin: Skin color, texture, turgor normal. No rashes or lesions Lymph nodes: Cervical, supraclavicular, and axillary nodes normal. Neuro:  awake and interactive with normal mood and affect. Higher cortical functions are normal. Speech is clear without word-finding difficulty or dysarthria. Extraocular movements are intact. Visual fields of both eyes are grossly intact. Sensation to light touch is grossly intact bilaterally of upper and lower extremities. Motor examination shows  5/5 symmetric hand grip and upper extremity and 5/5 lower extremity strength. There is no pronation or drift. Gait is non-ataxic  straight leg lift is negative.   Lab Results  Component Value Date   HGBA1C 4.9 11/17/2019    Lab Results  Component Value Date   CREATININE 0.81 03/30/2022   CREATININE 0.76 03/12/2022   CREATININE 0.70 11/17/2019    Lab Results  Component Value Date   WBC 7.0 03/30/2022   HGB 13.3 03/30/2022   HCT 39.7 03/30/2022    PLT 357.0 03/30/2022   GLUCOSE 86 03/30/2022   CHOL 192 11/17/2019   TRIG 89.0 11/17/2019   HDL 45.90 11/17/2019   LDLCALC 129 (H) 11/17/2019   ALT 10 03/30/2022   AST 10 03/30/2022   NA 138 03/30/2022   K 3.9 03/30/2022   CL 104 03/30/2022   CREATININE 0.81 03/30/2022   BUN 14 03/30/2022   CO2 28 03/30/2022   TSH 0.72 11/24/2019   HGBA1C 4.9 11/17/2019    CT Abdomen Pelvis W Contrast  Result Date: 04/02/2022 CLINICAL DATA:  Persistent hematuria. Recent diagnosis of pyelonephritis. EXAM: CT ABDOMEN AND PELVIS WITH CONTRAST TECHNIQUE: Multidetector CT imaging of the abdomen and pelvis was performed using the standard protocol following bolus administration of intravenous contrast. RADIATION DOSE REDUCTION: This exam was performed according to the departmental dose-optimization program which includes automated exposure control, adjustment of the mA and/or kV according to patient size and/or use of iterative reconstruction technique. CONTRAST:  OMNIPAQUE IOHEXOL 300 MG/ML  SOLN COMPARISON:  CT abdomen pelvis dated 03/12/2022. FINDINGS: Lower chest: The visualized lung bases are clear. No intra-abdominal free air or free fluid. Hepatobiliary: No focal liver abnormality is seen. No gallstones, gallbladder wall thickening, or biliary dilatation. Pancreas: Unremarkable. No pancreatic ductal dilatation or surrounding inflammatory changes. Spleen: Normal in size without focal abnormality. Adrenals/Urinary Tract: The adrenal glands are unremarkable. There is no hydronephrosis on either side. There is symmetric enhancement and excretion of contrast by both kidneys. Interval resolution of the previously seen right renal lower pole pyelonephritis. No abscess. The visualized ureters and urinary bladder appear unremarkable. Stomach/Bowel: There is no bowel obstruction or active inflammation. The appendix is normal. Vascular/Lymphatic: The abdominal aorta and IVC are unremarkable. No portal venous gas.  There is no adenopathy. Reproductive: The uterus is anteverted and grossly unremarkable. No adnexal masses. Other: None Musculoskeletal: No acute or significant osseous findings. IMPRESSION: Interval resolution of the previously seen right renal lower pole pyelonephritis. No abscess. Electronically Signed   By: Elgie Collard M.D.   On: 04/02/2022 01:59    Assessment & Plan:   Problem List Items Addressed This Visit     Chronic right-sided low back pain without sciatica - Primary    Exam is c/w musculoskeletal pain.  Plain films to evaluate for scoliosis given chronicity.  Tylenol, PT evalation for back strengthening       Relevant Orders   DG Lumbar Spine Complete   POCT urine pregnancy   Ambulatory referral to Physical Therapy    I spent a total of  26 minutes with this patient in a face to face visit on the date of this encounter reviewing the last office visit with me in August.   patient's work  and exercise habits, reviewing previous labs and imaging studies ,   and post visit ordering of testing and therapeutics.    Follow-up: No follow-ups on file.   Sherlene Shams, MD

## 2022-06-16 NOTE — Assessment & Plan Note (Signed)
Exam is c/w musculoskeletal pain.  Plain films to evaluate for scoliosis given chronicity.  Tylenol, PT evalation for back strengthening

## 2022-06-16 NOTE — Patient Instructions (Signed)
  You can add up to 2000 mg of acetominophen (tylenol) every day safely  In divided doses (500 mg every 6 hours  Or 1000 mg every 12 hours.)  for your back pain    X rays to evaluate your alignment and rule out scoliosis as a contributor   PT referral for strengthening exercises

## 2022-07-21 ENCOUNTER — Ambulatory Visit: Payer: 59 | Admitting: Physical Therapy

## 2022-07-23 ENCOUNTER — Ambulatory Visit: Payer: 59 | Admitting: Physical Therapy

## 2022-07-29 ENCOUNTER — Ambulatory Visit: Payer: 59 | Admitting: Physical Therapy

## 2022-08-05 ENCOUNTER — Encounter: Payer: Self-pay | Admitting: Physical Therapy

## 2022-08-11 ENCOUNTER — Ambulatory Visit: Payer: 59 | Attending: Internal Medicine | Admitting: Physical Therapy

## 2022-08-11 ENCOUNTER — Encounter: Payer: Self-pay | Admitting: Physical Therapy

## 2022-08-11 ENCOUNTER — Other Ambulatory Visit: Payer: Self-pay | Admitting: Internal Medicine

## 2022-08-11 DIAGNOSIS — M7051 Other bursitis of knee, right knee: Secondary | ICD-10-CM

## 2022-08-11 DIAGNOSIS — G8929 Other chronic pain: Secondary | ICD-10-CM | POA: Insufficient documentation

## 2022-08-11 DIAGNOSIS — M25561 Pain in right knee: Secondary | ICD-10-CM | POA: Diagnosis not present

## 2022-08-11 DIAGNOSIS — M545 Low back pain, unspecified: Secondary | ICD-10-CM | POA: Diagnosis not present

## 2022-08-11 NOTE — Progress Notes (Signed)
Referral to PT for chronic right knee pain made per patient request

## 2022-08-11 NOTE — Therapy (Signed)
OUTPATIENT PHYSICAL THERAPY LOWER EXTREMITY EVALUATION   Patient Name: Sylvia Meadows MRN: [id number] DOB:September 28, 2000, 22 y.o., female Today's Date: 08/11/2022  END OF SESSION:  PT End of Session - 08/11/22 1304     Visit Number 1    Number of Visits 10    Date for PT Re-Evaluation 10/09/22    Authorization - Visit Number 1    Authorization - Number of Visits 17    Progress Note Due on Visit 10    PT Start Time 1305    PT Stop Time 1345    PT Time Calculation (min) 40 min    Activity Tolerance Patient tolerated treatment well    Behavior During Therapy [facility] for tasks assessed/performed             Past Medical History:  Diagnosis Date   Acne 11/19/2019   Anxiety    Depression    Low vitamin D level 11/19/2019   Obsessive-compulsive disorder    Tremors of nervous system 11/19/2019   Past Surgical History:  Procedure Laterality Date   ADENOIDECTOMY AND MYRINGOTOMY WITH TUBE PLACEMENT     WISDOM TOOTH EXTRACTION     Patient Active Problem List   Diagnosis Date Noted   Chronic right-sided low back pain without sciatica 06/16/2022   Hematuria, gross 03/30/2022   Bursitis of right knee 09/15/2020   MDD (major depressive disorder), recurrent episode, mild (HCC) 11/19/2019   Tremors of nervous system 11/19/2019   Acne 11/19/2019   Low vitamin D level 11/19/2019   Anxiety and depression 08/26/2018   Obsessive-compulsive disorder 08/26/2018    PCP: Darrick Huntsman MD  REFERRING PROVIDER: Darrick Huntsman MD  REFERRING DIAG: LBP without sciatica   Rationale for Evaluation and Treatment: Rehabilitation  THERAPY DIAG:  No diagnosis found.  ONSET DATE: 08/11/21  SUBJECTIVE:                                                                                                                                                                                           SUBJECTIVE STATEMENT: Chronic R knee pain  PERTINENT HISTORY:  Pt is a 22 year old female presenting with LBP since Aug after having a  kidney infection. Has not had back pain much since Dec 2023. Has residual R knee pain. Had knee pain prior to kidney infection, and injection in Feb 2023. Had imaging done of her knee that was negative. Current R knee pain 4/10; worst 6/10. Knee pain feels sore and it pops with squatting. Reports knee pain is posteriolateral and travels up hip as well. Knee pain is aggravated after a long shift at work, prolonged standing >75mins, ascending  steps, and standing from a chair. She works as a Sales promotion account executive. Patient enjoys reading.   PAIN:  Are you having pain? Yes: NPRS scale: 4/10 Pain location: R knee posteriolateral  Pain description: achy Aggravating factors: being up on her feet for a while, a long shift, standing from a chair Relieving factors: tylenol, switching between ice and heat  PRECAUTIONS: None  WEIGHT BEARING RESTRICTIONS: No  FALLS:  Has patient fallen in last 6 months? No  LIVING ENVIRONMENT: Lives with: lives with their family Lives in: House/apartment Stairs: Yes: Internal: 20 steps; on left going up Has following equipment at home: None  OCCUPATION: Barista   PLOF: Independent  PATIENT GOALS: decrease pain   NEXT MD VISIT:   OBJECTIVE:   DIAGNOSTIC FINDINGS:  Xray and MRI Feb 2023 negative   PATIENT SURVEYS:  FOTO 53 goal 40  COGNITION: Overall cognitive status: Within functional limits for tasks assessed     SENSATION: WFL   MUSCLE LENGTH: Hamstrings: 50% limited bilat Thomas test: WNL bilat  POSTURE: rounded shoulders, forward head, increased lumbar lordosis, increased thoracic kyphosis, and anterior pelvic tilt  PALPATION: TTP at ITB insertion and at distal portion; secondary with tension at glute max superiolateral insertion   LOWER EXTREMITY ROM:  Active ROM Right eval Left eval  Hip flexion WNL WNL  Hip extension WNL WNL  Hip abduction WNL WNL  Hip adduction WNL WNL  Hip internal rotation WNL WNL  Hip external  rotation WNL WNL  Knee flexion WNL WNL  Knee extension WNL WNL  Ankle dorsiflexion    Ankle plantarflexion    Ankle inversion    Ankle eversion     (Blank rows = not tested)  LOWER EXTREMITY MMT:  MMT Right eval Left eval  Hip flexion 5 5  Hip extension 4+ 4+  Hip abduction 4+ 4+  Hip adduction    Hip internal rotation 5 5  Hip external rotation 4 4  Knee flexion 5 5  Knee extension 5 5  Ankle dorsiflexion    Ankle plantarflexion    Ankle inversion    Ankle eversion     (Blank rows = not tested)  LOWER EXTREMITY SPECIAL TESTS:  Hip special tests: Saralyn Pilar (FABER) test: negative, Ober's test: negative, Ely's test: negative, and Piriformis test: negative Knee special tests: Posterior drawer test: negative, Lachman Test: negative, Apley's test: negative, and Thessaly test: positive McMurray Negative Noble compression POSITIVE  FUNCTIONAL TESTS:  SL Squat L: 14 R: 12  Stair amb 4 steps ascent and descent without handrail with increased R knee pain with ascent; slow speed Plank 18sec - fatigue no pain  Beighton scale 8/9 (only not able to complete hands to floor)  GAIT: Distance walked: 76M Assistive device utilized: None Level of assistance: Complete Independence Comments: decreased speed, maintained posture throughout   TODAY'S TREATMENT:  DATE: 08/11/22  PT reviewed the following HEP with patient with patient able to demonstrate a set of the following with min cuing for correction needed. PT educated patient on parameters of therex (how/when to inc/decrease intensity, frequency, rep/set range, stretch hold time, and purpose of therex) with verbalized understanding.   Access Code: [email or web address] - Standard Plank  - 2 x weekly - 3 reps - 30sec hold - Bridge with Hip Abduction and Resistance - Ground Touches  - 2 x weekly - 2-3 sets - 12 reps - Bird Dog  - 2  x weekly - 2-3 sets - 12-20 reps  PATIENT EDUCATION:  Education details: Patient was educated on diagnosis, anatomy and pathology involved, prognosis, role of PT, and was given an HEP, demonstrating exercise with proper form following verbal and tactile cues, and was given a paper hand out to continue exercise at home. Pt was educated on and agreed to plan of care.  Person educated: Patient Education method: Consulting civil engineer, Demonstration, and Handouts Education comprehension: verbalized understanding, returned demonstration, and verbal cues required  HOME EXERCISE PROGRAM: Access Code: [email or web address]  - Standard Plank  - 2 x weekly - 3 reps - 30sec hold - Bridge with Hip Abduction and Resistance - Ground Touches  - 2 x weekly - 2-3 sets - 12 reps - Bird Dog  - 2 x weekly - 2-3 sets - 12-20 reps  ASSESSMENT:  CLINICAL IMPRESSION: Patient is a 22 y.o. 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 Impairments in general hypermobility, decreased core and hip strength, abnormal posture, decreased motor control, and pain. 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 Would benefit from skilled PT to address above deficits and promote optimal return to PLOF.   OBJECTIVE IMPAIRMENTS: Abnormal gait, decreased activity tolerance, decreased coordination, decreased endurance, decreased mobility, difficulty walking, decreased strength, increased fascial restrictions, impaired flexibility, impaired tone, improper body mechanics, postural dysfunction, and pain.   ACTIVITY LIMITATIONS: carrying, lifting, bending, standing, squatting, stairs, transfers, and bathing  PARTICIPATION LIMITATIONS: meal prep, cleaning, shopping, community activity, and occupation  PERSONAL FACTORS: Fitness, Past/current experiences,  Time since onset of injury/illness/exacerbation, and 3+ comorbidities: chronic LBP, A&D, chronic knee pain  are also affecting patient's functional outcome.   REHAB POTENTIAL: Good  CLINICAL DECISION MAKING: Evolving/moderate complexity  EVALUATION COMPLEXITY: Moderate   GOALS: Goals reviewed with patient? No  SHORT TERM GOALS: Target date: 09/02/22 Pt will be independent with HEP in order to improve strength and balance in order to decrease fall risk and improve function at home and work.  Baseline: 08/11/21 Goal status: INITIAL   LONG TERM GOALS: Target date: 10/09/22  Patient will increase FOTO score to 73 to demonstrate predicted increase in functional mobility to complete ADLs  Baseline: 65 Goal status: INITIAL  2.  Pt will decrease worst pain as reported on NPRS by at least 3 points in order to demonstrate clinically significant reduction in pain.  Baseline: 6/10 Goal status: INITIAL  3.  Pt will be able to demonstrate 13 SL squats of RLE in order to demonstrate 90% muscular strength/endurance compared to non-affected side.  Baseline: 12 Goal status: INITIAL     PLAN:  PT FREQUENCY: 1-2x/week  PT DURATION: 8 weeks  PLANNED INTERVENTIONS: Therapeutic exercises, Therapeutic activity, Neuromuscular re-education, Balance training, Gait training, Patient/Family education, Self Care, Joint mobilization, Joint manipulation, Stair training,  Dry Needling, Electrical stimulation, Spinal manipulation, Spinal mobilization, Cryotherapy, Moist heat, Traction, Ultrasound, Manual therapy, and Re-evaluation  PLAN FOR NEXT SESSION: THT, HEP review, eccentric hamstring strengthening  Durwin Reges DPT Durwin Reges, PT 08/11/2022, 2:08 PM

## 2022-08-14 ENCOUNTER — Ambulatory Visit: Payer: 59 | Admitting: Physical Therapy

## 2022-08-18 ENCOUNTER — Ambulatory Visit: Payer: 59 | Admitting: Physical Therapy

## 2022-08-20 ENCOUNTER — Encounter: Payer: Self-pay | Admitting: Physical Therapy

## 2022-08-20 ENCOUNTER — Ambulatory Visit: Payer: 59 | Admitting: Physical Therapy

## 2022-08-20 DIAGNOSIS — M545 Low back pain, unspecified: Secondary | ICD-10-CM | POA: Diagnosis not present

## 2022-08-20 DIAGNOSIS — G8929 Other chronic pain: Secondary | ICD-10-CM

## 2022-08-20 DIAGNOSIS — M25561 Pain in right knee: Secondary | ICD-10-CM | POA: Diagnosis not present

## 2022-08-20 NOTE — Therapy (Signed)
OUTPATIENT PHYSICAL THERAPY LOWER EXTREMITY Treatment/DC Summary Reporting Period 08/11/22 - 08/20/22   Patient Name: Sylvia Meadows MRN: [id number] DOB:24-May-2001, 22 y.o., female Today's Date: 08/20/2022  END OF SESSION:  PT End of Session - 08/20/22 1008     Visit Number 2    Number of Visits 10    Date for PT Re-Evaluation 10/09/22    Authorization - Visit Number 2    Authorization - Number of Visits 17    Progress Note Due on Visit 10    PT Start Time 1000    PT Stop Time 1025    PT Time Calculation (min) 25 min    Activity Tolerance Patient tolerated treatment well    Behavior During Therapy [facility] for tasks assessed/performed              Past Medical History:  Diagnosis Date   Acne 11/19/2019   Anxiety    Depression    Low vitamin D level 11/19/2019   Obsessive-compulsive disorder    Tremors of nervous system 11/19/2019   Past Surgical History:  Procedure Laterality Date   ADENOIDECTOMY AND MYRINGOTOMY WITH TUBE PLACEMENT     WISDOM TOOTH EXTRACTION     Patient Active Problem List   Diagnosis Date Noted   Chronic right-sided low back pain without sciatica 06/16/2022   Hematuria, gross 03/30/2022   Bursitis of right knee 09/15/2020   MDD (major depressive disorder), recurrent episode, mild ([place]) 11/19/2019   Tremors of nervous system 11/19/2019   Acne 11/19/2019   Low vitamin D level 11/19/2019   Anxiety and depression 08/26/2018   Obsessive-compulsive disorder 08/26/2018    PCP: Derrel Nip MD  REFERRING PROVIDER: Derrel Nip MD  REFERRING DIAG: LBP without sciatica   Rationale for Evaluation and Treatment: Rehabilitation  THERAPY DIAG:  No diagnosis found.  ONSET DATE: 08/11/21  SUBJECTIVE:                                                                                                                                                                                           SUBJECTIVE STATEMENT: Chronic R knee pain  PERTINENT HISTORY:  Pt is a 22 year old  female presenting with LBP since Aug after having a kidney infection. Has not had back pain much since Dec 2023. Has residual R knee pain. Had knee pain prior to kidney infection, and injection in Feb 2023. Had imaging done of her knee that was negative. Current R knee pain 4/10; worst 6/10. Knee pain feels sore and it pops with squatting. Reports knee pain is posteriolateral and travels up hip as well. Knee pain is aggravated after a long  shift at work, prolonged standing >105mins, ascending steps, and standing from a chair. She works as a Financial trader. Patient enjoys reading.   PAIN:  Are you having pain? Yes: NPRS scale: 4/10 Pain location: R knee posteriolateral  Pain description: achy Aggravating factors: being up on her feet for a while, a long shift, standing from a chair Relieving factors: tylenol, switching between ice and heat  PRECAUTIONS: None  WEIGHT BEARING RESTRICTIONS: No  FALLS:  Has patient fallen in last 6 months? No  LIVING ENVIRONMENT: Lives with: lives with their family Lives in: House/apartment Stairs: Yes: Internal: 20 steps; on left going up Has following equipment at home: None  OCCUPATION: Barista   PLOF: Independent  PATIENT GOALS: decrease pain   NEXT MD VISIT:   OBJECTIVE:   DIAGNOSTIC FINDINGS:  Xray and MRI Feb 2023 negative   PATIENT SURVEYS:  FOTO 53 goal 85  COGNITION: Overall cognitive status: Within functional limits for tasks assessed     SENSATION: WFL   MUSCLE LENGTH: Hamstrings: 50% limited bilat Thomas test: WNL bilat  POSTURE: rounded shoulders, forward head, increased lumbar lordosis, increased thoracic kyphosis, and anterior pelvic tilt  PALPATION: TTP at ITB insertion and at distal portion; secondary with tension at glute max superiolateral insertion   LOWER EXTREMITY ROM:  Active ROM Right eval Left eval  Hip flexion WNL WNL  Hip extension WNL WNL  Hip abduction WNL WNL  Hip adduction WNL  WNL  Hip internal rotation WNL WNL  Hip external rotation WNL WNL  Knee flexion WNL WNL  Knee extension WNL WNL  Ankle dorsiflexion    Ankle plantarflexion    Ankle inversion    Ankle eversion     (Blank rows = not tested)  LOWER EXTREMITY MMT:  MMT Right eval Left eval  Hip flexion 5 5  Hip extension 4+ 4+  Hip abduction 4+ 4+  Hip adduction    Hip internal rotation 5 5  Hip external rotation 4 4  Knee flexion 5 5  Knee extension 5 5  Ankle dorsiflexion    Ankle plantarflexion    Ankle inversion    Ankle eversion     (Blank rows = not tested)  LOWER EXTREMITY SPECIAL TESTS:  Hip special tests: Luisa Hart (FABER) test: negative, Ober's test: negative, Ely's test: negative, and Piriformis test: negative Knee special tests: Posterior drawer test: negative, Lachman Test: negative, Apley's test: negative, and Thessaly test: positive McMurray Negative Noble compression POSITIVE  FUNCTIONAL TESTS:  SL Squat L: 14 R: 12  Stair amb 4 steps ascent and descent without handrail with increased R knee pain with ascent; slow speed Plank 18sec - fatigue no pain  Beighton scale 8/9 (only not able to complete hands to floor)  GAIT: Distance walked: 39M Assistive device utilized: None Level of assistance: Complete Independence Comments: decreased speed, maintained posture throughout   TODAY'S TREATMENT:  DATE: 08/11/22  Nustep seat 7 UE 11 for gentle mobility/strengthening, min cuing for SPM between 70-80spm  Plank to failure: 44sec (during eval 18sec)  PT reviewed the following HEP with patient with patient able to demonstrate a set of the following with min cuing for correction needed. PT educated patient on parameters of therex (how/when to inc/decrease intensity, frequency, rep/set range, stretch hold time, and purpose of therex) with verbalized  understanding.  - Standard Plank  - 2 x weekly - 3 reps - 30sec hold - Bridge with Hip Abduction and Resistance - Ground Touches  - 2 x weekly - 2-3 sets - 12 reps - Bird Dog  - 2 x weekly - 2-3 sets - 12-20 reps - Cat Cow  - 1-2 x daily - 7 x weekly - 12 reps - Child's Pose Stretch  - 1-2 x daily - 7 x weekly - 30-60sec hold  PATIENT EDUCATION:  Education details: Patient was educated on diagnosis, anatomy and pathology involved, prognosis, role of PT, and was given an HEP, demonstrating exercise with proper form following verbal and tactile cues, and was given a paper hand out to continue exercise at home. Pt was educated on and agreed to plan of care.  Person educated: Patient Education method: Explanation, Demonstration, and Handouts Education comprehension: verbalized understanding, returned demonstration, and verbal cues required  HOME EXERCISE PROGRAM: Access Code: [email or web address]  - Standard Plank  - 2 x weekly - 3 reps - 30sec hold - Bridge with Hip Abduction and Resistance - Ground Touches  - 2 x weekly - 2-3 sets - 12 reps - Bird Dog  - 2 x weekly - 2-3 sets - 12-20 reps  ASSESSMENT:  CLINICAL IMPRESSION: PT reassessed goals this session where patient has met all goals to safely d/c formal PT. Patient is able to demonstrate and verbalize understanding of all HEP recommendations with minimal corrections needed. Pt able to demonstrate and understanding of working posture and lifting mechanics. Pt given clinic contact info should further questions or concerns arise. Pt to d/c PT.     OBJECTIVE IMPAIRMENTS: Abnormal gait, decreased activity tolerance, decreased coordination, decreased endurance, decreased mobility, difficulty walking, decreased strength, increased fascial restrictions, impaired flexibility, impaired tone, improper body mechanics, postural dysfunction, and pain.   ACTIVITY LIMITATIONS: carrying, lifting, bending, standing, squatting, stairs, transfers, and  bathing  PARTICIPATION LIMITATIONS: meal prep, cleaning, shopping, community activity, and occupation  PERSONAL FACTORS: Fitness, Past/current experiences, Time since onset of injury/illness/exacerbation, and 3+ comorbidities: chronic LBP, A&D, chronic knee pain  are also affecting patient's functional outcome.   REHAB POTENTIAL: Good  CLINICAL DECISION MAKING: Evolving/moderate complexity  EVALUATION COMPLEXITY: Moderate   GOALS: Goals reviewed with patient? No  SHORT TERM GOALS: Target date: 09/02/22 Pt will be independent with HEP in order to improve strength and balance in order to decrease fall risk and improve function at home and work.  Baseline: 08/11/21 Goal status: INITIAL   LONG TERM GOALS: Target date: 10/09/22  Patient will increase FOTO score to 73 to demonstrate predicted increase in functional mobility to complete ADLs  Baseline: 65; 08/20/22 79 Goal status: MET  2.  Pt will decrease worst pain as reported on NPRS by at least 3 points in order to demonstrate clinically significant reduction in pain.  Baseline: 6/10 08/20/22 3/10 Goal status: MET  3.  Pt will be able to demonstrate 13 SL squats of RLE in order to demonstrate 90% muscular strength/endurance compared to non-affected side.  Baseline: 12 08/20/22 14  Goal status: MET     PLAN:  PT FREQUENCY: 1-2x/week  PT DURATION: 8 weeks  PLANNED INTERVENTIONS: Therapeutic exercises, Therapeutic activity, Neuromuscular re-education, Balance training, Gait training, Patient/Family education, Self Care, Joint mobilization, Joint manipulation, Stair training, Dry Needling, Electrical stimulation, Spinal manipulation, Spinal mobilization, Cryotherapy, Moist heat, Traction, Ultrasound, Manual therapy, and Re-evaluation  PLAN FOR NEXT SESSION: THT, HEP review, eccentric hamstring strengthening  Durwin Reges DPT Durwin Reges, PT 08/20/2022, 10:26 AM

## 2022-08-25 ENCOUNTER — Ambulatory Visit: Payer: 59 | Admitting: Physical Therapy

## 2022-08-25 ENCOUNTER — Encounter: Payer: 59 | Admitting: Physical Therapy

## 2022-08-28 ENCOUNTER — Ambulatory Visit: Payer: 59 | Admitting: Physical Therapy

## 2022-10-11 ENCOUNTER — Ambulatory Visit (HOSPITAL_COMMUNITY)
Admission: EM | Admit: 2022-10-11 | Discharge: 2022-10-11 | Disposition: A | Discharge status: Home / Self Care | Payer: 59 | Attending: Nurse Practitioner | Admitting: Nurse Practitioner

## 2022-10-11 ENCOUNTER — Encounter (HOSPITAL_COMMUNITY): Payer: Self-pay

## 2022-10-11 DIAGNOSIS — R350 Frequency of micturition: Secondary | ICD-10-CM | POA: Diagnosis not present

## 2022-10-11 LAB — POC URINE PREG, ED: Preg Test, Ur: NEGATIVE

## 2022-10-11 MED ORDER — NITROFURANTOIN MONOHYD MACRO 100 MG PO CAPS: 100.0000 mg | ORAL_CAPSULE | Freq: Two times a day (BID) | ORAL | 0 refills | Status: DC

## 2022-10-11 NOTE — ED Triage Notes (Signed)
"  I have UTI symptoms including Nausea". I got scared about 6 mos "because I had a bad UTI then". No dysuria. Some urinary frequency/urgency. "Back pain also same as when I had the UTI before". No fever. No vaginal discharge. No concern for STI.

## 2022-10-11 NOTE — ED Provider Notes (Signed)
[place]    CSN: [id number] Arrival date & time: 10/11/22  1627      History   Chief Complaint Chief Complaint  Patient presents with   UTI Symptoms    HPI Sylvia Meadows is a 22 y.o. female.   Subjective:  Sylvia Meadows is a 22 y.o. female who complains of urinary frequency, low back pain and nausea for 3 days.  Patient denies fever, stomach ache, vaginal discharge, dysuria, nausea, vomiting, chills or flank pain.  Patient does not have a history of recurrent UTI.  Patient does have a history of pyelonephritis but 6 months ago.  Patient is concerned because her current symptoms are similar to her prior episode of pyelonephritis.  Last menstrual cycle was 4 weeks ago.  She is not on birth control and denies any sexual activity.  The following portions of the patient's history were reviewed and updated as appropriate: allergies, current medications, past family history, past medical history, past social history, past surgical history, and problem list.       Past Medical History:  Diagnosis Date   Acne 11/19/2019   Anxiety    Depression    Low vitamin D level 11/19/2019   Obsessive-compulsive disorder    Tremors of nervous system 11/19/2019    Patient Active Problem List   Diagnosis Date Noted   Chronic right-sided low back pain without sciatica 06/16/2022   Hematuria, gross 03/30/2022   Bursitis of right knee 09/15/2020   MDD (major depressive disorder), recurrent episode, mild ([place]) 11/19/2019   Tremors of nervous system 11/19/2019   Acne 11/19/2019   Low vitamin D level 11/19/2019   Anxiety and depression 08/26/2018   Obsessive-compulsive disorder 08/26/2018    Past Surgical History:  Procedure Laterality Date   ADENOIDECTOMY AND MYRINGOTOMY WITH TUBE PLACEMENT     WISDOM TOOTH EXTRACTION      OB History   No obstetric history on file.      Home Medications    Prior to Admission medications   Medication Sig Start Date End Date Taking?  Authorizing Provider  nitrofurantoin, macrocrystal-monohydrate, (MACROBID) 100 MG capsule Take 1 capsule (100 mg total) by mouth 2 (two) times daily. 10/11/22  Yes Enrique Sack, FNP    Family History Family History  Problem Relation Age of Onset   Hyperlipidemia Mother    Diabetes Mother    Mental illness Mother    Depression Mother    Alcohol abuse Father    Asthma Sister    Depression Maternal Aunt    Anxiety disorder Maternal Aunt    Anxiety disorder Maternal Grandfather    Depression Maternal Grandfather    Hyperlipidemia Maternal Grandfather    Hypertension Maternal Grandfather    Depression Paternal Grandmother    Anxiety disorder Paternal Grandmother    Arthritis Maternal Grandmother    Mental illness Maternal Grandmother    Tremor Neg Hx     Social History Social History   Tobacco Use   Smoking status: Never   Smokeless tobacco: Never  Vaping Use   Vaping Use: Never used  Substance Use Topics   Alcohol use: Yes    Comment: Occassionally.   Drug use: Never     Allergies   Patient has no known allergies.   Review of Systems Review of Systems  Constitutional:  Negative for fever.  Gastrointestinal:  Positive for nausea. Negative for vomiting.  Genitourinary:  Positive for frequency. Negative for dysuria, flank pain and vaginal discharge.  Musculoskeletal:  Positive for back pain.  All other systems reviewed and are negative.    Physical Exam Triage Vital Signs ED Triage Vitals  Enc Vitals Group     BP 10/11/22 1746 111/74     Pulse Rate 10/11/22 1746 88     Resp 10/11/22 1746 16     Temp 10/11/22 1746 98.3 F (36.8 C)     Temp Source 10/11/22 1746 Oral     SpO2 10/11/22 1746 97 %     Weight 10/11/22 1744 145 lb (65.8 kg)     Height 10/11/22 1744 '5\' 4"'$  (1.626 m)     Head Circumference --      Peak Flow --      Pain Score 10/11/22 1743 3     Pain Loc --      Pain Edu? --      Excl. in [place]? --    No data found.  Updated Vital Signs BP  111/74 (BP Location: Right Arm)   Pulse 88   Temp 98.3 F (36.8 C) (Oral)   Resp 16   Ht '5\' 4"'$  (1.626 m)   Wt 145 lb (65.8 kg)   LMP 09/03/2022 (Exact Date)   SpO2 97%   BMI 24.89 kg/m   Visual Acuity Right Eye Distance:   Left Eye Distance:   Bilateral Distance:    Right Eye Near:   Left Eye Near:    Bilateral Near:     Physical Exam Vitals reviewed.  Constitutional:      General: She is not in acute distress.    Appearance: Normal appearance. She is not ill-appearing or toxic-appearing.  HENT:     Head: Normocephalic.  Cardiovascular:     Rate and Rhythm: Normal rate.  Pulmonary:     Effort: Pulmonary effort is normal.  Abdominal:     Palpations: Abdomen is soft.     Tenderness: There is no right CVA tenderness or left CVA tenderness.  Musculoskeletal:        General: Normal range of motion.     Cervical back: Normal range of motion and neck supple.  Skin:    General: Skin is warm and dry.  Neurological:     General: No focal deficit present.     Mental Status: She is alert and oriented to person, place, and time.      UC Treatments / Results  Labs (all labs ordered are listed, but only abnormal results are displayed) Labs Reviewed  URINE CULTURE  POCT URINALYSIS DIPSTICK, ED / UC  POC URINE PREG, ED    EKG   Radiology No results found.  Procedures Procedures (including critical care time)  Medications Ordered in UC Medications - No data to display  Initial Impression / Assessment and Plan / UC Course  I have reviewed the triage vital signs and the nursing notes.  Pertinent labs & imaging results that were available during my care of the patient were reviewed by me and considered in my medical decision making (see chart for details).    22 year old female presenting with a 3-day history of urinary frequency, low back pain and nausea.  No dysuria or fevers.  She is afebrile.  Nontoxic.  Urinalysis shows a trace amount of leukocytes but  otherwise unremarkable.  Urine cultures pending.  Will empirically start on Macrobid.  Patient advised to maintain adequate hydration.  Urinary hygiene measures also discussed with patient and her mother.  Today's evaluation has revealed no signs of a dangerous process. Discussed  diagnosis with patient and/or guardian. Patient and/or guardian aware of their diagnosis, possible red flag symptoms to watch out for and need for close follow up. Patient and/or guardian understands verbal and written discharge instructions. Patient and/or guardian comfortable with plan and disposition.  Patient and/or guardian has a clear mental status at this time, good insight into illness (after discussion and teaching) and has clear judgment to make decisions regarding their care  Documentation was completed with the aid of voice recognition software. Transcription may contain typographical errors. Final Clinical Impressions(s) / UC Diagnoses   Final diagnoses:  Urinary frequency     Discharge Instructions      Your urine test showed a small amount of leukocytes which may indicate that you have an urinary tract infection   Take antibiotics as prescribed. Make sure you finish all the antibiotics even if you start to feel better.   We have sent your urine out for cultures which is a lab test to check for bacteria or other germs in your urine sample. You will only be notified about these results if we have to change your antibiotics.   Make sure you: Empty your bladder often and completely.  Do not hold urine for long periods of time. Empty your bladder after sex. Wipe from front to back after urinating or having a bowel movement if you are female. Drink enough fluid to keep your urine pale yellow.  Go to the ED immediately if:  You have severe pain in your back or your lower abdomen. You have a fever or chills. You have nausea or vomiting.     ED Prescriptions     Medication Sig Dispense Auth.  Provider   nitrofurantoin, macrocrystal-monohydrate, (MACROBID) 100 MG capsule Take 1 capsule (100 mg total) by mouth 2 (two) times daily. 10 capsule Enrique Sack, FNP      PDMP not reviewed this encounter.   Enrique Sack, [place] 10/11/22 [phone]

## 2022-10-11 NOTE — Discharge Instructions (Addendum)
Your urine test showed a small amount of leukocytes which may indicate that you have an urinary tract infection   Take antibiotics as prescribed. Make sure you finish all the antibiotics even if you start to feel better.   We have sent your urine out for cultures which is a lab test to check for bacteria or other germs in your urine sample. You will only be notified about these results if we have to change your antibiotics.   Make sure you: Empty your bladder often and completely.  Do not hold urine for long periods of time. Empty your bladder after sex. Wipe from front to back after urinating or having a bowel movement if you are female. Drink enough fluid to keep your urine pale yellow.  Go to the ED immediately if:  You have severe pain in your back or your lower abdomen. You have a fever or chills. You have nausea or vomiting.

## 2022-10-12 LAB — POCT URINALYSIS DIPSTICK, ED / UC
Bilirubin Urine: NEGATIVE
Glucose, UA: NEGATIVE mg/dL
Hgb urine dipstick: NEGATIVE
Ketones, ur: NEGATIVE mg/dL
Nitrite: NEGATIVE
Protein, ur: NEGATIVE mg/dL
Specific Gravity, Urine: 1.01 (ref 1.005–1.030)
Urobilinogen, UA: 0.2 mg/dL (ref 0.0–1.0)
pH: 7 (ref 5.0–8.0)

## 2022-10-12 LAB — URINE CULTURE: Culture: NO GROWTH

## 2022-10-13 DIAGNOSIS — H5213 Myopia, bilateral: Secondary | ICD-10-CM | POA: Diagnosis not present

## 2022-10-18 ENCOUNTER — Encounter: Payer: Self-pay | Admitting: Emergency Medicine

## 2022-10-18 ENCOUNTER — Ambulatory Visit
Admission: EM | Admit: 2022-10-18 | Discharge: 2022-10-18 | Disposition: A | Discharge status: Home / Self Care | Payer: 59 | Attending: Family Medicine | Admitting: Family Medicine

## 2022-10-18 DIAGNOSIS — B3731 Acute candidiasis of vulva and vagina: Secondary | ICD-10-CM | POA: Insufficient documentation

## 2022-10-18 DIAGNOSIS — N390 Urinary tract infection, site not specified: Secondary | ICD-10-CM | POA: Insufficient documentation

## 2022-10-18 LAB — URINALYSIS, W/ REFLEX TO CULTURE (INFECTION SUSPECTED)
Bilirubin Urine: NEGATIVE
Glucose, UA: NEGATIVE mg/dL
Hgb urine dipstick: NEGATIVE
Ketones, ur: NEGATIVE mg/dL
Leukocytes,Ua: NEGATIVE
Nitrite: NEGATIVE
Protein, ur: NEGATIVE mg/dL
Specific Gravity, Urine: 1.02 (ref 1.005–1.030)
pH: 6.5 (ref 5.0–8.0)

## 2022-10-18 LAB — WET PREP, GENITAL
Clue Cells Wet Prep HPF POC: NONE SEEN
Sperm: NONE SEEN
Trich, Wet Prep: NONE SEEN
WBC, Wet Prep HPF POC: <10 — AB (ref <10)

## 2022-10-18 LAB — PREGNANCY, URINE: Preg Test, Ur: NEGATIVE

## 2022-10-18 MED ORDER — CEPHALEXIN 500 MG PO CAPS: 500.0000 mg | ORAL_CAPSULE | Freq: Four times a day (QID) | ORAL | 0 refills | Status: DC

## 2022-10-18 MED ORDER — FLUCONAZOLE 150 MG PO TABS: 150.0000 mg | ORAL_TABLET | ORAL | 0 refills | Status: AC

## 2022-10-18 NOTE — Discharge Instructions (Addendum)
You had evidence of of a possible UTI and yeast infection today.  Stop by the pharmacy to pick up your prescriptions.  For your UTI: Take Keflex 4 times a day for the next 5 days  For yeast infection: Take the first dose of Diflucan on day 3 of antibiotics and the day after you complete your antibiotics take the last dose.  If your symptoms do not improve in the next 7 days, be sure to follow-up here or at your primary care provider office.  Go to the emergency department if you are having increasing pain, worsening vaginal bleeding or fever.

## 2022-10-18 NOTE — ED Triage Notes (Signed)
Patient c/o lower abdominal pain and urinary frequency that started yesterday.  Patient states hat she was seen for a UTI last weekend.

## 2022-10-18 NOTE — ED Provider Notes (Signed)
[facility]    CSN: [id number] Arrival date & time: 10/18/22  1358      History   Chief Complaint Chief Complaint  Patient presents with   Abdominal Pain   Urinary Frequency     HPI HPI Sylvia Meadows is a 22 y.o. female.    Sylvia Meadows presents for abdominal pain and urinary frequency. Has abdominal pain and right lower back pain. Was seen 10/11/22 for the same and was told that her culture did not have any growth and to stop antibiotics. She only took 2 days worth.  Denies known STD exposure.   Patient's last menstrual period was 09/03/2022 (exact date).  Mom concerned as she had a UTI previously that progressed to pyelonephritis without cough.      Past Medical History:  Diagnosis Date   Acne 11/19/2019   Anxiety    Depression    Low vitamin D level 11/19/2019   Obsessive-compulsive disorder    Tremors of nervous system 11/19/2019    Patient Active Problem List   Diagnosis Date Noted   Chronic right-sided low back pain without sciatica 06/16/2022   Hematuria, [place] 03/30/2022   Bursitis of right knee 09/15/2020   MDD (major depressive disorder), recurrent episode, mild ([place]) 11/19/2019   Tremors of nervous system 11/19/2019   Acne 11/19/2019   Low vitamin D level 11/19/2019   Anxiety and depression 08/26/2018   Obsessive-compulsive disorder 08/26/2018    Past Surgical History:  Procedure Laterality Date   ADENOIDECTOMY AND MYRINGOTOMY WITH TUBE PLACEMENT     WISDOM TOOTH EXTRACTION      OB History   No obstetric history on file.      Home Medications    Prior to Admission medications   Medication Sig Start Date End Date Taking? Authorizing Provider  cephALEXin (KEFLEX) 500 MG capsule Take 1 capsule (500 mg total) by mouth 4 (four) times daily. 10/18/22  Yes Aicha Clingenpeel, DO  fluconazole (DIFLUCAN) 150 MG tablet Take 1 tablet (150 mg total) by mouth every 3 (three) days for 2 doses. 10/21/22 10/25/22 Yes Lyndee Hensen, DO    Family  History Family History  Problem Relation Age of Onset   Hyperlipidemia Mother    Diabetes Mother    Mental illness Mother    Depression Mother    Alcohol abuse Father    Asthma Sister    Depression Maternal Aunt    Anxiety disorder Maternal Aunt    Anxiety disorder Maternal Grandfather    Depression Maternal Grandfather    Hyperlipidemia Maternal Grandfather    Hypertension Maternal Grandfather    Depression Paternal Grandmother    Anxiety disorder Paternal Grandmother    Arthritis Maternal Grandmother    Mental illness Maternal Grandmother    Tremor Neg Hx     Social History Social History   Tobacco Use   Smoking status: Never   Smokeless tobacco: Never  Vaping Use   Vaping Use: Never used  Substance Use Topics   Alcohol use: Yes    Comment: Occassionally.   Drug use: Never     Allergies   Patient has no known allergies.   Review of Systems Review of Systems: :negative unless otherwise stated in HPI.      Physical Exam Triage Vital Signs ED Triage Vitals  Enc Vitals Group     BP 10/18/22 1459 116/71     Pulse Rate 10/18/22 1459 80     Resp 10/18/22 1459 14     Temp  10/18/22 1459 98.3 F (36.8 C)     Temp Source 10/18/22 1459 Oral     SpO2 10/18/22 1459 100 %     Weight 10/18/22 1458 135 lb (61.2 kg)     Height 10/18/22 1458 5\' 4"  (1.626 m)     Head Circumference --      Peak Flow --      Pain Score 10/18/22 1458 3     Pain Loc --      Pain Edu? --      Excl. in [place]? --    No data found.  Updated Vital Signs BP 116/71 (BP Location: Left Arm)   Pulse 80   Temp 98.3 F (36.8 C) (Oral)   Resp 14   Ht 5\' 4"  (1.626 m)   Wt 61.2 kg   LMP 09/03/2022 (Exact Date)   SpO2 100%   BMI 23.17 kg/m   Visual Acuity Right Eye Distance:   Left Eye Distance:   Bilateral Distance:    Right Eye Near:   Left Eye Near:    Bilateral Near:     Physical Exam GEN: well appearing female in no acute distress  CVS: well perfused  RESP: speaking in full  sentences without pause  ABD: soft, suprapubic tenderness, non-distended, no palpable masses,  + right CVA tenderness  GU: deferred, patient performed self swab  SKIN: warm and dry   UC Treatments / Results  Labs (all labs ordered are listed, but only abnormal results are displayed) Labs Reviewed  WET PREP, GENITAL - Abnormal; Notable for the following components:      Result Value   Yeast Wet Prep HPF POC PRESENT (*)    WBC, Wet Prep HPF POC <10 (*)    All other components within normal limits  URINALYSIS, W/ REFLEX TO CULTURE (INFECTION SUSPECTED) - Abnormal; Notable for the following components:   Bacteria, UA FEW (*)    All other components within normal limits  PREGNANCY, URINE    EKG   Radiology No results found.  Procedures Procedures (including critical care time)  Medications Ordered in UC Medications - No data to display  Initial Impression / Assessment and Plan / UC Course  I have reviewed the triage vital signs and the nursing notes.  Pertinent labs & imaging results that were available during my care of the patient were reviewed by me and considered in my medical decision making (see chart for details).      Patient is a 22 y.o.Mar[place] Kitchen female  who presents for urinary frequency and abdominal pain.  Overall patient is well-appearing and afebrile.  Vital signs stable.  Last menstrual cycle in February. Urine pregnancy test is negative. UA with evidence of possible acute cystitis.   Hematuria not supported on microscopy.  Treat with Keflex 4 times daily for 5 days.  Yeast vaginitis supported  on wet prep. Pt had taken Macrobid recently. Consider STI  testing at next visit if symptoms do not improve. Treatment: Keflex as above. And Diflucan for 2 doses for antibiotic associated yeast infection   Return precautions including abdominal pain, fever, chills, nausea, or vomiting given. Discussed MDM, treatment plan and plan for follow-up with patient  who agrees with plan.        Final Clinical Impressions(s) / UC Diagnoses   Final diagnoses:  Lower urinary tract infectious disease  Yeast vaginitis     Discharge Instructions      You had evidence of of a possible UTI and yeast infection  today.  Stop by the pharmacy to pick up your prescriptions.  For your UTI: Take Keflex 4 times a day for the next 5 days  For yeast infection: Take the first dose of Diflucan on day 3 of antibiotics and the day after you complete your antibiotics take the last dose.  If your symptoms do not improve in the next 7 days, be sure to follow-up here or at your primary care provider office.  Go to the emergency department if you are having increasing pain, worsening vaginal bleeding or fever.      ED Prescriptions     Medication Sig Dispense Auth. Provider   cephALEXin (KEFLEX) 500 MG capsule Take 1 capsule (500 mg total) by mouth 4 (four) times daily. 20 capsule Philander Ake, DO   fluconazole (DIFLUCAN) 150 MG tablet Take 1 tablet (150 mg total) by mouth every 3 (three) days for 2 doses. 2 tablet Lyndee Hensen, DO      PDMP not reviewed this encounter.   Lyndee Hensen, DO 10/19/22 1111

## 2022-11-24 ENCOUNTER — Ambulatory Visit: Payer: 59 | Admitting: Family

## 2022-12-29 ENCOUNTER — Encounter: Payer: Self-pay | Admitting: Family

## 2022-12-29 ENCOUNTER — Ambulatory Visit (INDEPENDENT_AMBULATORY_CARE_PROVIDER_SITE_OTHER): Payer: 59 | Admitting: Family

## 2022-12-29 VITALS — BP 112/70 | HR 92 | Temp 97.7°F | Ht 64.0 in | Wt 146.2 lb

## 2022-12-29 DIAGNOSIS — M7051 Other bursitis of knee, right knee: Secondary | ICD-10-CM | POA: Diagnosis not present

## 2022-12-29 DIAGNOSIS — Z23 Encounter for immunization: Secondary | ICD-10-CM

## 2022-12-29 DIAGNOSIS — R251 Tremor, unspecified: Secondary | ICD-10-CM

## 2022-12-29 DIAGNOSIS — G8929 Other chronic pain: Secondary | ICD-10-CM | POA: Diagnosis not present

## 2022-12-29 DIAGNOSIS — F419 Anxiety disorder, unspecified: Secondary | ICD-10-CM

## 2022-12-29 DIAGNOSIS — F429 Obsessive-compulsive disorder, unspecified: Secondary | ICD-10-CM | POA: Diagnosis not present

## 2022-12-29 DIAGNOSIS — M545 Low back pain, unspecified: Secondary | ICD-10-CM

## 2022-12-29 DIAGNOSIS — F32A Depression, unspecified: Secondary | ICD-10-CM

## 2022-12-29 NOTE — Assessment & Plan Note (Signed)
Intermittent

## 2022-12-29 NOTE — Assessment & Plan Note (Signed)
Ibuprofen tylenol prn Work on exercises for sciatica

## 2022-12-29 NOTE — Patient Instructions (Signed)
Welcome to our clinic, I am happy to have you as my new patient. I am excited to continue on this healthcare journey with you.  Stop by the lab prior to leaving today. I will notify you of your results once received.   Please keep in mind Any my chart messages you send have up to a three business day turnaround for a response.  Phone calls may take up to a one full business day turnaround for a  response.   If you need a medication refill I recommend you request it through the pharmacy as this is easiest for us rather than sending a message and or phone call.   Due to recent changes in healthcare laws, you may see results of your imaging and/or laboratory studies on MyChart before I have had a chance to review them.  I understand that in some cases there may be results that are confusing or concerning to you. Please understand that not all results are received at the same time and often I may need to interpret multiple results in order to provide you with the best plan of care or course of treatment. Therefore, I ask that you please give me 2 business days to thoroughly review all your results before contacting my office for clarification. Should we see a critical lab result, you will be contacted sooner.   It was a pleasure seeing you today! Please do not hesitate to reach out with any questions and or concerns.  Regards,   Shaunee Mulkern FNP-C  

## 2022-12-29 NOTE — Assessment & Plan Note (Signed)
Stable

## 2022-12-29 NOTE — Progress Notes (Signed)
Established Patient Office Visit  Subjective:  Patient ID: Sylvia Meadows, female    DOB: 12-20-2000  Age: 22 y.o. MRN: [id number]  CC:  Chief Complaint  Patient presents with   Establish Care    TOC from Dr Darrick Huntsman.    HPI Sylvia Meadows is here for a transition of care visit.  Prior provider was: Dr. Darrick Huntsman   Pt is with acute concerns.  Does still have some back pain, without radiation of pain down her back. She states the pain is in her middle of her buttocks and goes up in her mid to right side of back.   Td tetanus vaccine last in 2012.   chronic concerns:  Chronic sciatica: went to physical therapy a few months ago, hasn't much been following the regimen.   OCD: has seen psychiatry in the past. Currently going to a therapist, goes about once a month. She does state stable at the moment.   Right hand tremor, been going on for years. Has had an MRI in the past, states was negative findings.       Past Medical History:  Diagnosis Date   Acne 11/19/2019   Anxiety    Depression    Low vitamin D level 11/19/2019   Obsessive-compulsive disorder    Tremors of nervous system 11/19/2019    Past Surgical History:  Procedure Laterality Date   ADENOIDECTOMY AND MYRINGOTOMY WITH TUBE PLACEMENT     WISDOM TOOTH EXTRACTION      Family History  Problem Relation Age of Onset   Hyperlipidemia Mother    Diabetes Mother    Mental illness Mother    Depression Mother    Alcohol abuse Father    Asthma Sister    Arthritis Maternal Grandmother    Mental illness Maternal Grandmother    Anxiety disorder Maternal Grandfather    Depression Maternal Grandfather    Hyperlipidemia Maternal Grandfather    Hypertension Maternal Grandfather    Depression Maternal Aunt    Anxiety disorder Maternal Aunt    Tremor Neg Hx     Social History   Socioeconomic History   Marital status: Single    Spouse name: Not on file   Number of children: Not on file   Years of education: Not on file    Highest education level: Not on file  Occupational History   Occupation: barista [employer]  Tobacco Use   Smoking status: Never   Smokeless tobacco: Never  Vaping Use   Vaping Use: Never used  Substance and Sexual Activity   Alcohol use: Yes    Comment: Occassionally.   Drug use: Never   Sexual activity: Never  Other Topics Concern   Not on file  Social History Narrative    She is single and lives at home with her mother and sister.  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  She currently does not have a boyfriend.  She reports she is safe at home.      Has relationship good with both parents   Lives with mom , parents divorced.    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 20 capsule 0   No facility-administered medications prior to visit.    No Known Allergies  ROS: Pertinent symptoms negative unless otherwise noted in HPI      Objective:    Physical Exam Vitals reviewed.  Constitutional:      Appearance: Normal appearance.  Eyes:     General:        Right eye: No discharge.        Left eye: No discharge.     Conjunctiva/sclera: Conjunctivae normal.  Cardiovascular:     Rate and Rhythm: Normal rate.  Pulmonary:     Effort: Pulmonary effort is normal. No respiratory distress.  Musculoskeletal:        General: Normal range of motion.     Cervical back: Normal range of motion.     Comments: Tenderness right mid buttock up to sciatic notch   Neurological:      General: No focal deficit present.     Mental Status: She is alert and oriented to person, place, and time. Mental status is at baseline.  Psychiatric:        Mood and Affect: Mood normal.        Behavior: Behavior normal.        Thought Content: Thought content normal.        Judgment: Judgment normal.       BP 112/70 (BP Location: Left Arm)   Pulse 92   Temp 97.7 F (36.5 C) (Temporal)   Ht 5\' 4"  (1.626 m)   Wt 146 lb 3.2 oz (66.3 kg)   LMP 10/26/2022 (Approximate) Comment: Irregular  SpO2 99%   BMI 25.10 kg/m  Wt Readings from Last 3 Encounters:  12/29/22 146 lb 3.2 oz (66.3 kg)  10/18/22 135 lb (61.2 kg)  10/11/22 145 lb (65.8 kg)     Health Maintenance Due  Topic Date Due   PAP SMEAR-Modifier  Never done    There are no preventive care reminders to display for this patient.  Lab Results  Component Value Date   TSH 0.72 11/24/2019   Lab Results  Component Value Date   WBC 7.0 03/30/2022   HGB 13.3 03/30/2022   HCT 39.7 03/30/2022   MCV 91.5 03/30/2022   PLT 357.0 03/30/2022   Lab Results  Component Value Date   NA 138 03/30/2022   K 3.9 03/30/2022   CO2 28 03/30/2022   GLUCOSE 86 03/30/2022   BUN 14 03/30/2022   CREATININE 0.81 03/30/2022   BILITOT 0.5 03/30/2022   ALKPHOS 50 03/30/2022   AST 10 03/30/2022   ALT 10 03/30/2022   PROT 6.9 03/30/2022   ALBUMIN 4.6 03/30/2022   CALCIUM 9.6 03/30/2022   ANIONGAP 10 03/12/2022   GFR 103.55 03/30/2022   Lab Results  Component Value Date   CHOL 192 11/17/2019   Lab Results  Component Value Date   HDL 45.90 11/17/2019   Lab Results  Component Value Date   LDLCALC 129 (H) 11/17/2019   Lab Results  Component Value Date   TRIG 89.0 11/17/2019   Lab Results  Component Value Date   CHOLHDL 4 11/17/2019   Lab Results  Component Value Date   HGBA1C 4.9 11/17/2019      Assessment & Plan:   Bursitis of right knee, unspecified bursa Assessment &  Plan: Intermittent   Tremors of  nervous system  Chronic right-sided low back pain without sciatica Assessment & Plan: Ibuprofen tylenol prn Work on exercises for sciatica    Anxiety and depression Assessment & Plan: Stable    Obsessive-compulsive disorder, unspecified type Assessment & Plan: Stable  If in future this changes will refer to psychiatry    Other orders -     Td vaccine greater than or equal to 7yo preservative free IM    No orders of the defined types were placed in this encounter.   Follow-up: Return in about 1 year (around 12/29/2023) for f/u CPE.    Mort Sawyers, FNP

## 2022-12-29 NOTE — Assessment & Plan Note (Signed)
Stable  If in future this changes will refer to psychiatry

## 2023-02-24 ENCOUNTER — Encounter: Payer: Self-pay | Admitting: Family Medicine

## 2023-02-24 ENCOUNTER — Ambulatory Visit (INDEPENDENT_AMBULATORY_CARE_PROVIDER_SITE_OTHER): Payer: 59 | Admitting: Family Medicine

## 2023-02-24 VITALS — BP 110/78 | HR 87 | Temp 98.3°F | Ht 64.0 in | Wt 137.0 lb

## 2023-02-24 DIAGNOSIS — R197 Diarrhea, unspecified: Secondary | ICD-10-CM | POA: Diagnosis not present

## 2023-02-24 DIAGNOSIS — R3915 Urgency of urination: Secondary | ICD-10-CM

## 2023-02-24 LAB — POC URINALSYSI DIPSTICK (AUTOMATED)
Bilirubin, UA: NEGATIVE
Blood, UA: 10
Glucose, UA: NEGATIVE
Ketones, UA: 5
Leukocytes, UA: NEGATIVE
Nitrite, UA: NEGATIVE
Protein, UA: POSITIVE — AB
Spec Grav, UA: 1.02 (ref 1.010–1.025)
Urobilinogen, UA: 0.2 E.U./dL
pH, UA: 6 (ref 5.0–8.0)

## 2023-02-24 NOTE — Progress Notes (Signed)
Patient ID: Sylvia Meadows, female    DOB: 09/21/2000, 22 y.o.   MRN: [id number]  This visit was conducted in person.  BP 110/78 (BP Location: Left Arm, Patient Position: Sitting, Cuff Size: Normal)   Pulse 87   Temp 98.3 F (36.8 C) (Temporal)   Ht 5\' 4"  (1.626 m)   Wt 137 lb (62.1 kg)   SpO2 98%   BMI 23.52 kg/m    CC:  Chief Complaint  Patient presents with   Urinary Urgency    Urine is cloudy, has had a HA and diarrhea at one time and has abdominal and back pain since Saturday.     Subjective:   HPI: Sylvia Meadows is a 22 y.o. female presenting on 02/24/2023 for Urinary Urgency (Urine is cloudy, has had a HA and diarrhea at one time and has abdominal and back pain since Saturday. )   She reports new onset  4 day history of  upper abdominal cramping.  Started with diarrhea, watery stool.. 5 ties a day  Abdominal  pain and diarrhea return.  Has noted cloudy urine Has had some low back pain... may be her sciatica she has had in past.  Some decrease in frequency,  feels like she has to urinate even  No  blood in urine, not on menses.  No sexual activity.  No regular BMs, no fever.      Relevant past medical, surgical, family and social history reviewed and updated as indicated. Interim medical history since our last visit reviewed. Allergies and medications reviewed and updated. No outpatient medications prior to visit.   No facility-administered medications prior to visit.     Per HPI unless specifically indicated in ROS section below Review of Systems  Constitutional:  Negative for fatigue and fever.  HENT:  Negative for congestion.   Eyes:  Negative for pain.  Respiratory:  Negative for cough and shortness of breath.   Cardiovascular:  Negative for chest pain, palpitations and leg swelling.  Gastrointestinal:  Positive for abdominal pain and diarrhea. Negative for nausea and vomiting.  Genitourinary:  Positive for urgency. Negative for dysuria and vaginal  bleeding.  Musculoskeletal:  Positive for back pain.  Neurological:  Negative for syncope, light-headedness and headaches.  Psychiatric/Behavioral:  Negative for dysphoric mood.    Objective:  BP 110/78 (BP Location: Left Arm, Patient Position: Sitting, Cuff Size: Normal)   Pulse 87   Temp 98.3 F (36.8 C) (Temporal)   Ht 5\' 4"  (1.626 m)   Wt 137 lb (62.1 kg)   SpO2 98%   BMI 23.52 kg/m   Wt Readings from Last 3 Encounters:  02/24/23 137 lb (62.1 kg)  12/29/22 146 lb 3.2 oz (66.3 kg)  10/18/22 135 lb (61.2 kg)      Physical Exam Constitutional:      General: She is not in acute distress.    Appearance: Normal appearance. She is well-developed. She is not ill-appearing or toxic-appearing.  HENT:     Head: Normocephalic.     Right Ear: Hearing, tympanic membrane, ear canal and external ear normal. Tympanic membrane is not erythematous, retracted or bulging.     Left Ear: Hearing, tympanic membrane, ear canal and external ear normal. Tympanic membrane is not erythematous, retracted or bulging.     Nose: No mucosal edema or rhinorrhea.     Right Sinus: No maxillary sinus tenderness or frontal sinus tenderness.     Left Sinus: No maxillary sinus tenderness or frontal sinus  tenderness.     Mouth/Throat:     Mouth: Oropharynx is clear and moist and mucous membranes are normal.     Pharynx: Uvula midline.  Eyes:     General: Lids are normal. Lids are everted, no foreign bodies appreciated.     Extraocular Movements: EOM normal.     Conjunctiva/sclera: Conjunctivae normal.     Pupils: Pupils are equal, round, and reactive to light.  Neck:     Thyroid: No thyroid mass or thyromegaly.     Vascular: No carotid bruit.     Trachea: Trachea normal.  Cardiovascular:     Rate and Rhythm: Normal rate and regular rhythm.     Pulses: Normal pulses.     Heart sounds: Normal heart sounds, S1 normal and S2 normal. No murmur heard.    No friction rub. No gallop.  Pulmonary:     Effort:  Pulmonary effort is normal. No tachypnea or respiratory distress.     Breath sounds: Normal breath sounds. No decreased breath sounds, wheezing, rhonchi or rales.  Abdominal:     General: Bowel sounds are normal.     Palpations: Abdomen is soft.     Tenderness: There is abdominal tenderness in the epigastric area and suprapubic area. There is no right CVA tenderness or left CVA tenderness.  Musculoskeletal:     Cervical back: Normal range of motion and neck supple.     Lumbar back: Tenderness present. No bony tenderness. Normal range of motion. Negative right straight leg raise test and negative left straight leg raise test.  Skin:    General: Skin is warm, dry and intact.     Findings: No rash.  Neurological:     Mental Status: She is alert.  Psychiatric:        Mood and Affect: Mood is not anxious or depressed.        Speech: Speech normal.        Behavior: Behavior normal. Behavior is cooperative.        Thought Content: Thought content normal.        Cognition and Memory: Cognition and memory normal.        Judgment: Judgment normal.       Results for orders placed or performed in visit on 02/24/23  POCT Urinalysis Dipstick (Automated)  Result Value Ref Range   Color, UA light Amber    Clarity, UA slightly cloudy    Glucose, UA Negative Negative   Bilirubin, UA neg    Ketones, UA 5 mg/dL    Spec Grav, UA 1.610 1.010 - 1.025   Blood, UA 10 Ery/uL    pH, UA 6.0 5.0 - 8.0   Protein, UA Positive (A) Negative   Urobilinogen, UA 0.2 0.2 or 1.0 E.U./dL   Nitrite, UA neg    Leukocytes, UA Negative Negative    Assessment and Plan  Urinary urgency -     POCT Urinalysis Dipstick (Automated) -     Urine Culture  Diarrhea, unspecified type  Unclear cause of symptoms.  Possible viral gastroenteritis.  Patient states she has recurrent issues with urinary tract infections and was diagnosed with a kidney infection last year.  Urinalysis does not clearly show urinary tract  infection on but given blood will send for urine culture.  Pain is also not exactly consistent with kidney stone.  Recommended rest fluids and time for possible viral gastroenteritis.  She will contact me if her symptoms are changing for possible further evaluation.  No follow-ups  on file.   Kerby Nora, MD

## 2023-02-25 LAB — URINE CULTURE
MICRO NUMBER:: 15240983
SPECIMEN QUALITY:: ADEQUATE

## 2023-03-01 ENCOUNTER — Encounter: Payer: Self-pay | Admitting: Family

## 2023-03-02 ENCOUNTER — Ambulatory Visit: Payer: 59 | Admitting: Family

## 2023-05-11 ENCOUNTER — Encounter: Payer: Self-pay | Admitting: Family

## 2023-05-11 ENCOUNTER — Ambulatory Visit (INDEPENDENT_AMBULATORY_CARE_PROVIDER_SITE_OTHER): Payer: 59 | Admitting: Family

## 2023-05-11 VITALS — BP 110/72 | HR 101 | Temp 97.6°F | Ht 64.0 in | Wt 140.6 lb

## 2023-05-11 DIAGNOSIS — Z13220 Encounter for screening for lipoid disorders: Secondary | ICD-10-CM | POA: Diagnosis not present

## 2023-05-11 DIAGNOSIS — R7989 Other specified abnormal findings of blood chemistry: Secondary | ICD-10-CM

## 2023-05-11 DIAGNOSIS — Z0000 Encounter for general adult medical examination without abnormal findings: Secondary | ICD-10-CM

## 2023-05-11 DIAGNOSIS — R809 Proteinuria, unspecified: Secondary | ICD-10-CM | POA: Diagnosis not present

## 2023-05-11 LAB — BASIC METABOLIC PANEL
BUN: 18 mg/dL (ref 6–23)
CO2: 26 meq/L (ref 19–32)
Calcium: 9.7 mg/dL (ref 8.4–10.5)
Chloride: 105 meq/L (ref 96–112)
Creatinine, Ser: 0.74 mg/dL (ref 0.40–1.20)
GFR: 114.51 mL/min (ref >60.00)
Glucose, Bld: 99 mg/dL (ref 70–99)
Potassium: 4.7 meq/L (ref 3.5–5.1)
Sodium: 139 meq/L (ref 135–145)

## 2023-05-11 LAB — LIPID PANEL
Cholesterol: 210 mg/dL — ABNORMAL HIGH (ref 0–200)
HDL: 55.3 mg/dL (ref >39.00)
LDL Cholesterol: 136 mg/dL — ABNORMAL HIGH (ref 0–99)
NonHDL: 154.89
Total CHOL/HDL Ratio: 4
Triglycerides: 95 mg/dL (ref 0.0–149.0)
VLDL: 19 mg/dL (ref 0.0–40.0)

## 2023-05-11 LAB — VITAMIN D 25 HYDROXY (VIT D DEFICIENCY, FRACTURES): VITD: 8.34 ng/mL — ABNORMAL LOW (ref 30.00–100.00)

## 2023-05-11 LAB — CBC
HCT: 43.3 % (ref 36.0–46.0)
Hemoglobin: 14 g/dL (ref 12.0–15.0)
MCHC: 32.2 g/dL (ref 30.0–36.0)
MCV: 93.1 fL (ref 78.0–100.0)
Platelets: 293 10*3/uL (ref 150.0–400.0)
RBC: 4.65 Mil/uL (ref 3.87–5.11)
RDW: 13.3 % (ref 11.5–15.5)
WBC: 6.1 10*3/uL (ref 4.0–10.5)

## 2023-05-11 NOTE — Progress Notes (Signed)
Subjective:  Patient ID: Sylvia Meadows, female    DOB: [birth year]  Age: 22 y.o. MRN: [id number]  Patient Care Team: Mort Sawyers, FNP as PCP - General (Family Medicine)   CC:  Chief Complaint  Patient presents with   Annual Exam    HPI Sylvia Meadows is a 22 y.o. female who presents today for an annual physical exam. She reports consuming a general diet. The patient does not participate in regular exercise at present. She generally feels well. She reports sleeping poorly. She does not have additional problems to discuss today. She typically only sleeps about 3-6 hours. She has tried otc medications at times but she states they make her 'stay sleepy' tried melatonin without much relief.   Vision:Within last year Dental:No regular dental care  Last pap: Dec 29, 2022  Pt is without acute concerns.   Advanced Directives Patient does not have advanced directives    DEPRESSION SCREENING    05/11/2023    7:34 AM 02/24/2023   12:26 PM 12/29/2022   11:56 AM 06/16/2022    3:34 PM 03/30/2022    8:12 AM 09/13/2020    1:42 PM 11/17/2019   10:03 AM  PHQ 2/9 Scores  PHQ - 2 Score 0 0 0 0 0 1 4  PHQ- 9 Score 3 2    6 17      ROS: Negative unless specifically indicated above in HPI.   No current outpatient medications on file.    Objective:    BP 110/72   Pulse (!) 101   Temp 97.6 F (36.4 C) (Temporal)   Ht 5\' 4"  (1.626 m)   Wt 140 lb 9.6 oz (63.8 kg)   SpO2 97%   BMI 24.13 kg/m   BP Readings from Last 3 Encounters:  05/11/23 110/72  02/24/23 110/78  12/29/22 112/70      Physical Exam Constitutional:      General: She is not in acute distress.    Appearance: Normal appearance. She is normal weight. She is not ill-appearing.  HENT:     Head: Normocephalic.     Right Ear: Tympanic membrane normal.     Left Ear: Tympanic membrane normal.     Nose: Nose normal.     Mouth/Throat:     Mouth: Mucous membranes are moist.  Eyes:     Extraocular Movements: Extraocular  movements intact.     Pupils: Pupils are equal, round, and reactive to light.  Cardiovascular:     Rate and Rhythm: Normal rate and regular rhythm.  Pulmonary:     Effort: Pulmonary effort is normal.     Breath sounds: Normal breath sounds.  Abdominal:     Tenderness: There is no guarding or rebound.  Musculoskeletal:        General: Normal range of motion.     Cervical back: Normal range of motion.  Skin:    General: Skin is warm.     Capillary Refill: Capillary refill takes less than 2 seconds.  Neurological:     General: No focal deficit present.     Mental Status: She is alert.  Psychiatric:        Mood and Affect: Mood normal.        Behavior: Behavior normal.        Thought Content: Thought content normal.        Judgment: Judgment normal.          Assessment & Plan:  Encounter for general adult medical examination  without abnormal findings Assessment & Plan: Patient Counseling(The following topics were reviewed):  Preventative care handout given to pt  Health maintenance and immunizations reviewed. Please refer to Health maintenance section. Pt advised on safe sex, wearing seatbelts in car, and proper nutrition labwork ordered today for annual Dental health: Discussed importance of regular tooth brushing, flossing, and dental visits.   Orders: -     Lipid panel -     Basic metabolic panel -     CBC  Screening for lipoid disorders -     Lipid panel  Proteinuria, unspecified type Assessment & Plan: Repeat urine pending results.   Orders: -     Urinalysis w microscopic + reflex cultur  Low vitamin D level Assessment & Plan: Ordered vitamin d pending results.    Orders: -     VITAMIN D 25 Hydroxy (Vit-D Deficiency, Fractures)      Follow-up: Return in about 1 year (around 05/10/2024) for f/u CPE.   Mort Sawyers, FNP

## 2023-05-11 NOTE — Patient Instructions (Signed)
  Stop by the lab prior to leaving today. I will notify you of your results once received.   Recommendations on keeping yourself healthy:  - Exercise at least 30-45 minutes a day, 3-4 days a week.  - Eat a low-fat diet with lots of fruits and vegetables, up to 7-9 servings per day.  - Seatbelts can save your life. Wear them always.  - Smoke detectors on every level of your home, check batteries every year.  - Eye Doctor - have an eye exam every 1-2 years  - Safe sex - if you may be exposed to STDs, use a condom.  - Alcohol -  If you drink, do it moderately, less than 2 drinks per day.  - Health Care Power of Attorney. Choose someone to speak for you if you are not able.  - Depression is common in our stressful world.If you're feeling down or losing interest in things you normally enjoy, please come in for a visit.  - Violence - If anyone is threatening or hurting you, please call immediately.  Due to recent changes in healthcare laws, you may see results of your imaging and/or laboratory studies on MyChart before I have had a chance to review them.  I understand that in some cases there may be results that are confusing or concerning to you. Please understand that not all results are received at the same time and often I may need to interpret multiple results in order to provide you with the best plan of care or course of treatment. Therefore, I ask that you please give me 2 business days to thoroughly review all your results before contacting my office for clarification. Should we see a critical lab result, you will be contacted sooner.   I will see you again in one year for your annual comprehensive exam unless otherwise stated and or with acute concerns.  It was a pleasure seeing you today! Please do not hesitate to reach out with any questions and or concerns.  Regards,   Yarah Fuente    

## 2023-05-11 NOTE — Assessment & Plan Note (Signed)
Ordered vitamin d pending results.   

## 2023-05-11 NOTE — Assessment & Plan Note (Signed)
Patient Counseling(The following topics were reviewed): ? Preventative care handout given to pt  ?Health maintenance and immunizations reviewed. Please refer to Health maintenance section. ?Pt advised on safe sex, wearing seatbelts in car, and proper nutrition ?labwork ordered today for annual ?Dental health: Discussed importance of regular tooth brushing, flossing, and dental visits. ? ? ?

## 2023-05-11 NOTE — Assessment & Plan Note (Signed)
Repeat urine pending results.

## 2023-05-12 ENCOUNTER — Other Ambulatory Visit: Payer: Self-pay | Admitting: Family

## 2023-05-12 DIAGNOSIS — R7989 Other specified abnormal findings of blood chemistry: Secondary | ICD-10-CM

## 2023-05-12 LAB — URINALYSIS W MICROSCOPIC + REFLEX CULTURE
Bacteria, UA: NONE SEEN /[HPF]
Bilirubin Urine: NEGATIVE
Glucose, UA: NEGATIVE
Hgb urine dipstick: NEGATIVE
Hyaline Cast: NONE SEEN /[LPF]
Ketones, ur: NEGATIVE
Leukocyte Esterase: NEGATIVE
Nitrites, Initial: NEGATIVE
Protein, ur: NEGATIVE
RBC / HPF: NONE SEEN /[HPF] (ref 0–2)
Specific Gravity, Urine: 1.027 (ref 1.001–1.035)
WBC, UA: NONE SEEN /[HPF] (ref 0–5)
pH: 5.5 (ref 5.0–8.0)

## 2023-05-12 LAB — NO CULTURE INDICATED

## 2023-05-12 MED ORDER — CHOLECALCIFEROL 1.25 MG (50000 UT) PO TABS: 1.0000 | ORAL_TABLET | ORAL | 0 refills | Status: AC

## 2023-12-24 ENCOUNTER — Encounter: Payer: Self-pay | Admitting: Family

## 2023-12-24 DIAGNOSIS — G479 Sleep disorder, unspecified: Secondary | ICD-10-CM

## 2023-12-28 MED ORDER — TRAZODONE HCL 50 MG PO TABS: 25.0000 mg | ORAL_TABLET | Freq: Every evening | ORAL | 1 refills | Status: AC | PRN

## 2023-12-28 NOTE — Addendum Note (Signed)
 Addended by: Felicita Horns on: 12/28/2023 08:37 AM   Modules accepted: Orders

## 2024-08-24 ENCOUNTER — Encounter: Payer: Self-pay | Admitting: Family
